# Patient Record
Sex: Female | Born: 1985 | Race: White | Hispanic: No | Marital: Single | Smoking: Current every day smoker
Health system: Southern US, Community
[De-identification: ages and names within clinical notes are randomized; demographics above are authoritative.]

## PROBLEM LIST (undated history)

## (undated) DIAGNOSIS — F419 Anxiety disorder, unspecified: Secondary | ICD-10-CM

## (undated) DIAGNOSIS — A549 Gonococcal infection, unspecified: Secondary | ICD-10-CM

## (undated) DIAGNOSIS — K219 Gastro-esophageal reflux disease without esophagitis: Secondary | ICD-10-CM

## (undated) DIAGNOSIS — A749 Chlamydial infection, unspecified: Secondary | ICD-10-CM

## (undated) DIAGNOSIS — N83209 Unspecified ovarian cyst, unspecified side: Secondary | ICD-10-CM

## (undated) DIAGNOSIS — I10 Essential (primary) hypertension: Secondary | ICD-10-CM

## (undated) DIAGNOSIS — E559 Vitamin D deficiency, unspecified: Secondary | ICD-10-CM

## (undated) DIAGNOSIS — A539 Syphilis, unspecified: Secondary | ICD-10-CM

## (undated) DIAGNOSIS — N390 Urinary tract infection, site not specified: Secondary | ICD-10-CM

## (undated) DIAGNOSIS — E282 Polycystic ovarian syndrome: Secondary | ICD-10-CM

## (undated) HISTORY — PX: TONSILLECTOMY AND ADENOIDECTOMY: SUR1326

## (undated) HISTORY — PX: WISDOM TOOTH EXTRACTION: SHX21

---
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
## 2012-03-06 ENCOUNTER — Encounter (INDEPENDENT_AMBULATORY_CARE_PROVIDER_SITE_OTHER): Payer: Self-pay | Admitting: General Surgery

## 2012-03-08 ENCOUNTER — Ambulatory Visit (INDEPENDENT_AMBULATORY_CARE_PROVIDER_SITE_OTHER): Payer: BC Managed Care – PPO | Admitting: General Surgery

## 2012-03-08 ENCOUNTER — Encounter (INDEPENDENT_AMBULATORY_CARE_PROVIDER_SITE_OTHER): Payer: Self-pay | Admitting: General Surgery

## 2012-03-08 VITALS — BP 124/72 | HR 88 | Temp 97.6°F | Resp 20 | Ht 64.0 in | Wt 199.6 lb

## 2012-03-08 DIAGNOSIS — R1031 Right lower quadrant pain: Secondary | ICD-10-CM

## 2012-03-08 DIAGNOSIS — R1903 Right lower quadrant abdominal swelling, mass and lump: Secondary | ICD-10-CM

## 2012-03-08 NOTE — Patient Instructions (Signed)
We will get CT scan to further evaluate the lump in your right lower abdominal wall

## 2012-03-08 NOTE — Progress Notes (Signed)
Patient ID: Mary Dennis, female   DOB: Dec 18, 1985, 27 y.o.   MRN: [id number]  Chief Complaint  Patient presents with  . New Evaluation    eval abd wall hernia    HPI Mary Dennis is a 27 y.o. female.   HPI 27 yo WF referred by Dr Neva Seat for evaluation of a possible RLQ hernia. The patient states that she has had a lump in her right lower abdominal wall for about a year. She states that she had a prior hernia repair as a small child and has had a C-section about 3 years ago. She states that the lump can be occasionally tender. It mainly bothers her around her menstrual cycle. She also states that area gets harder and a little bit larger with her cycle. She denies any trauma to the area. She denies any other soft tissue masses. She denies any weight loss. She denies any vomiting, diarrhea, constipation, melena, or hematochezia. She denies any fevers or chills. She does have a problem with frequent urinary tract infections. She does smoke cigarettes on a daily basis.  History reviewed. No pertinent past medical history.  Past Surgical History  Procedure Date  . Hernia repair 1995    Family History  Problem Relation Age of Onset  . Hypertension Father   . Hypertension Mother   . Diabetes Maternal Grandfather   . Breast cancer Maternal Aunt     Social History History  Substance Use Topics  . Smoking status: Current Every Day Smoker -- 0.5 packs/day  . Smokeless tobacco: Never Used  . Alcohol Use: No    No Known Allergies  Current Outpatient Prescriptions  Medication Sig Dispense Refill  . nitrofurantoin (MACRODANTIN) 100 MG capsule Take 100 mg by mouth 4 (four) times daily.        Review of Systems Review of Systems  Constitutional: Negative for fever, chills and unexpected weight change.  HENT: Negative for hearing loss, congestion, sore throat, trouble swallowing and voice change.   Eyes: Negative for visual disturbance.  Respiratory: Negative for cough and wheezing.     Cardiovascular: Negative for chest pain, palpitations and leg swelling.  Gastrointestinal: Negative for vomiting, diarrhea, constipation, blood in stool, abdominal distention and anal bleeding.  Genitourinary: Positive for dysuria (has frequent UTI). Negative for hematuria, vaginal bleeding, vaginal discharge, difficulty urinating, vaginal pain and menstrual problem.  Musculoskeletal: Negative for arthralgias.  Skin: Negative for rash and wound.  Neurological: Negative for seizures, syncope and headaches.  Hematological: Negative for adenopathy. Does not bruise/bleed easily.  Psychiatric/Behavioral: Negative for confusion.    Blood pressure 124/72, pulse 88, temperature 97.6 F (36.4 C), temperature source Temporal, resp. rate 20, height 5\' 4"  (1.626 m), weight 199 lb 9.6 oz (90.538 kg), last menstrual period 02/16/2012.  Physical Exam Physical Exam  Vitals reviewed. Constitutional: She is oriented to person, place, and time. She appears well-developed and well-nourished. No distress.  HENT:  Head: Normocephalic and atraumatic.  Right Ear: External ear normal.  Left Ear: External ear normal.  Eyes: Conjunctivae normal are normal. No scleral icterus.  Neck: Normal range of motion. Neck supple. No tracheal deviation present. No thyromegaly present.  Cardiovascular: Normal rate, regular rhythm and normal heart sounds.   Pulmonary/Chest: Effort normal and breath sounds normal. No respiratory distress. She has no wheezes.  Abdominal: Soft. Bowel sounds are normal. She exhibits mass. She exhibits no distension. There is no tenderness. There is no rebound and no guarding.         RLQ  abd wall mass - deep and superior to transverse incision. Firm, non mobile, only mild TTP. Somewhat circumscribed, irregular shape. About size of golfball, doesn't change in size or position with valsalva or standing.   Musculoskeletal: She exhibits no edema.  Lymphadenopathy:    She has no cervical adenopathy.   Neurological: She is alert and oriented to person, place, and time.  Skin: Skin is warm and dry. No rash noted. She is not diaphoretic. No erythema. No pallor.  Psychiatric: She has a normal mood and affect. Her behavior is normal. Judgment and thought content normal.    Data Reviewed Dr Paralee Cancel note from 01/2012  Assessment    Right lower quadrant mass with pain    Plan    Differential diagnosis includes incisional hernia vs lipoma vs atypical endometrioma. The physical exam is not entirely consistent with a lipoma. It does not have the typical well-circumscribed features of a lipoma. However it also does not have the typical physical findings of an incisional hernia. The patient nonetheless does have deep subcutaneous mass that does not change with position. It is near a previous Pfannenstiel incision. My concern is for incisional hernia with herniated fat. My recommendation was to get a CT scan of her abdomen pelvis to further delineate her abdominal wall anatomy. We did discuss signs and symptoms of incarceration and strangulation. She'll be set up for CT scan of her abdomen pelvis for tomorrow. Followup will be based on the results of her CT scan. We did discuss the importance of smoking cessation.  Mary Sella. Andrey Campanile, MD, FACS General, Bariatric, & Minimally Invasive Surgery [facility], [place]        [facility] M 03/08/2012, 11:00 AM

## 2012-03-09 ENCOUNTER — Ambulatory Visit
Admission: RE | Admit: 2012-03-09 | Discharge: 2012-03-09 | Disposition: A | Discharge status: Home / Self Care | Payer: BC Managed Care – PPO | Source: Ambulatory Visit | Attending: General Surgery | Admitting: General Surgery

## 2012-03-09 ENCOUNTER — Telehealth (INDEPENDENT_AMBULATORY_CARE_PROVIDER_SITE_OTHER): Payer: Self-pay | Admitting: General Surgery

## 2012-03-09 DIAGNOSIS — R1031 Right lower quadrant pain: Secondary | ICD-10-CM

## 2012-03-09 MED ORDER — IOHEXOL 300 MG/ML  SOLN
125.0000 mL | Freq: Once | INTRAMUSCULAR | Status: AC | PRN
  Administered 2012-03-09: 125 mL via INTRAVENOUS

## 2012-03-09 NOTE — Telephone Encounter (Signed)
Called to discuss CT results - looks like focus of endometriosis given symptoms. Discussed observation vs excision. Pt wants excision. Discussed risk/benefits/postop course. Will have office call pt to schedule pt for excision of right abdominal wall subcutaneous mass

## 2012-03-10 ENCOUNTER — Telehealth (INDEPENDENT_AMBULATORY_CARE_PROVIDER_SITE_OTHER): Payer: Self-pay

## 2012-03-10 NOTE — Telephone Encounter (Signed)
Pt called stating the cost of surgery will be difficult at this time. Pt wants Dr Andrey Campanile to advise her if this is something that must be done at this time. Pt states area is only sore at times of her period. Pt advised I will send msg to Dr Andrey Campanile for review. Pt can be reached at [phone].

## 2012-03-13 NOTE — Telephone Encounter (Signed)
No, it doesn't have to been done now. This is most consistent with a benign non-cancerous nodule. Can be taken out at her convenience or when financially able.

## 2012-03-14 NOTE — Telephone Encounter (Signed)
Patient made aware and will call us with any questions.

## 2012-03-14 NOTE — Telephone Encounter (Signed)
Left message on machine for patient to call back and ask for me. To make her aware of Dr Tawana Scale advise.

## 2012-04-14 ENCOUNTER — Telehealth (INDEPENDENT_AMBULATORY_CARE_PROVIDER_SITE_OTHER): Payer: Self-pay | Admitting: General Surgery

## 2012-04-14 NOTE — Telephone Encounter (Signed)
She called twice about her pain medication refill at the [facility] in [facility]. I spoke with the people at the [facility] pharmacy and they stated they had a prescription but she would have to come pick it up personally. I discussed this with her.

## 2012-04-14 NOTE — Telephone Encounter (Signed)
Pt called to ask Dr. Andrey Campanile for pain meds.  She is having her menstrual cycle now and having pain and tenderness.  Her GYN office is closed when she called to ask for pain meds.  Paged and updated Dr. Andrey Campanile; ordered Vicodin # 20.  Hydrocodone 5/325 mg,  # 20, 1-2 po Q4-6H prn pain, no refill called to [facility] ([facility]):  [id number].  Pt is aware.

## 2012-06-01 ENCOUNTER — Telehealth (INDEPENDENT_AMBULATORY_CARE_PROVIDER_SITE_OTHER): Payer: Self-pay | Admitting: General Surgery

## 2012-06-01 MED ORDER — TRAMADOL HCL 50 MG PO TABS: 50.0000 mg | ORAL_TABLET | Freq: Four times a day (QID) | ORAL | Status: AC | PRN

## 2012-06-01 NOTE — Telephone Encounter (Signed)
Please advise to call in new medication or call her in something for nausea? It looks like she was given Norco.

## 2012-06-01 NOTE — Telephone Encounter (Signed)
Message copied by Liliana Cline on Thu Jun 01, 2012  1:50 PM ------      Message from: Marin Shutter      Created: Thu Jun 01, 2012 12:56 PM      Regarding: Dr. Andrey Campanile      Contact: [phone]       Pt has pain with her endometriosis, and the medication that he prescribed gave her nausea.  Can he prescribe another med? Also, she is wondering if her gyno has closed his office.  She cannot reach him.  Thx ------

## 2012-06-01 NOTE — Telephone Encounter (Signed)
Tramadol RX sent to patient's pharmacy. Patient made aware. She will call with any problems.

## 2012-06-01 NOTE — Telephone Encounter (Signed)
Can have ultram 50 mg 1 tab po q6hr prn pain, disp 40; 0 refills. i have no idea about her gyn

## 2012-06-01 NOTE — Addendum Note (Signed)
Addended byLiliana Cline on: 06/01/2012 03:45 PM   Modules accepted: Orders

## 2012-06-02 NOTE — Telephone Encounter (Signed)
Patient called today stating the tramadol is making her nauseated, gave her diarrhea and "made her irritable." I explained that we don't really have much else to offer her. She states she has had Lortab before without problems but I explained that Norco has the same medication in it. I advised her to try ibuprofen and she states this makes her menstrual flow heavier. I advised that she should stick with the ibuprofen if it helps with the pain but I would ask Dr Andrey Campanile for additional advise.

## 2013-12-09 IMAGING — CT CT ABD-PELV W/ CM
2 of 4 series · 17 of 46 positions shown, 19 images · IV contrast (READICAT/WATER & [ID] OMNI 300)
Comparison: None.

CLINICAL DATA: Right lower quadrant pain, evaluate for possible
incisional hernia

CT ABDOMEN AND PELVIS WITH CONTRAST
TECHNIQUE: Multidetector CT imaging of the abdomen and pelvis was
performed following the standard protocol during bolus
administration of intravenous contrast.
Contrast: 125mL OMNIPAQUE IOHEXOL 300 MG/ML  SOLN

[Series 2: abd/pelvis with · axial · 0.80mm/px · z∈[-426,-26]mm · 14 of 88 slices shown, 16 images]
[im 4/88  soft-tissue]
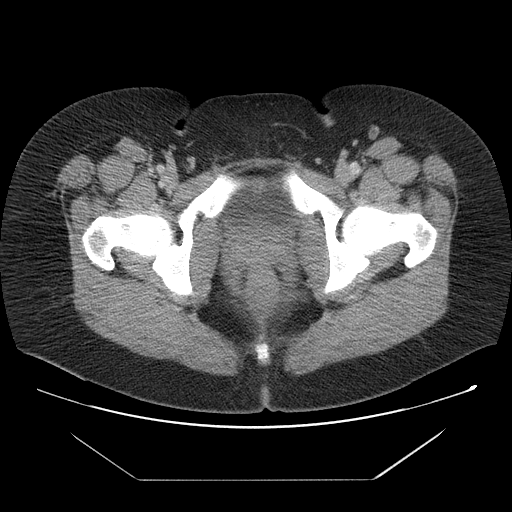
[im 4/88  bone]
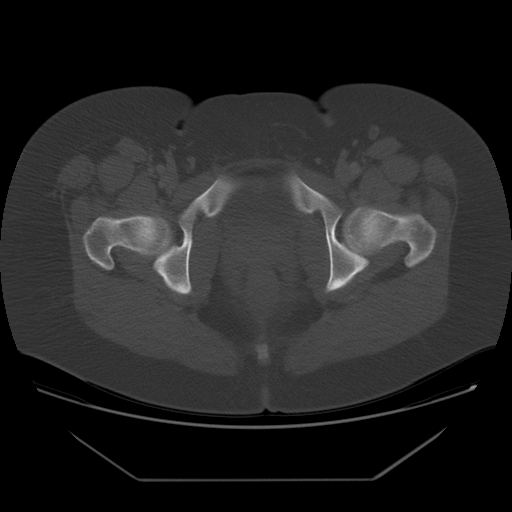
[im 11/88  soft-tissue]
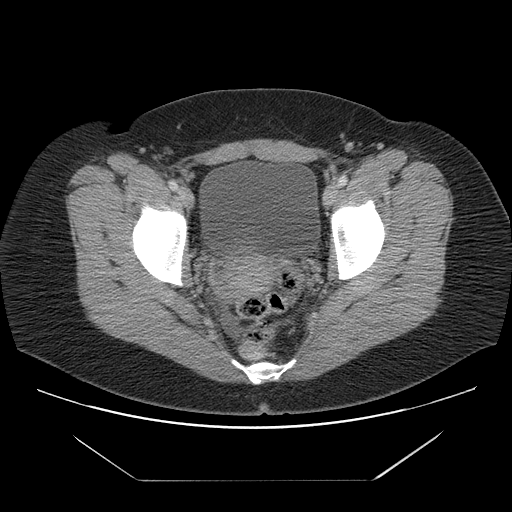
[im 18/88  soft-tissue]
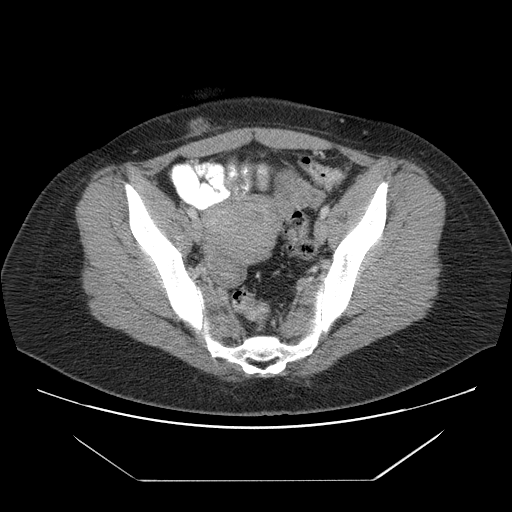
[im 25/88  soft-tissue]
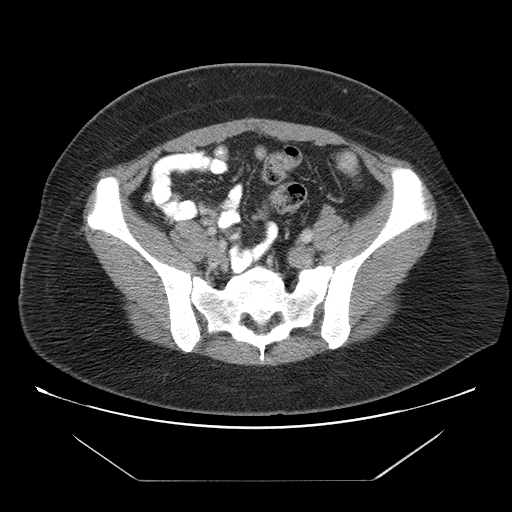
[im 28/88  soft-tissue]
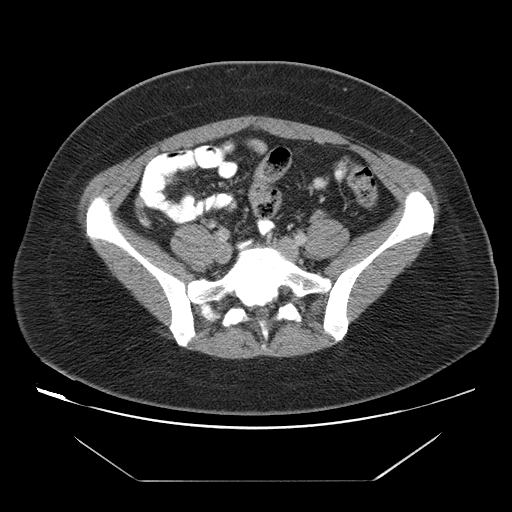
[im 35/88  soft-tissue]
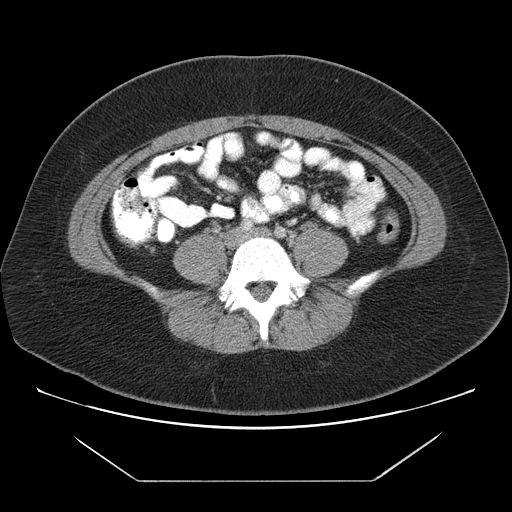
[im 42/88  soft-tissue]
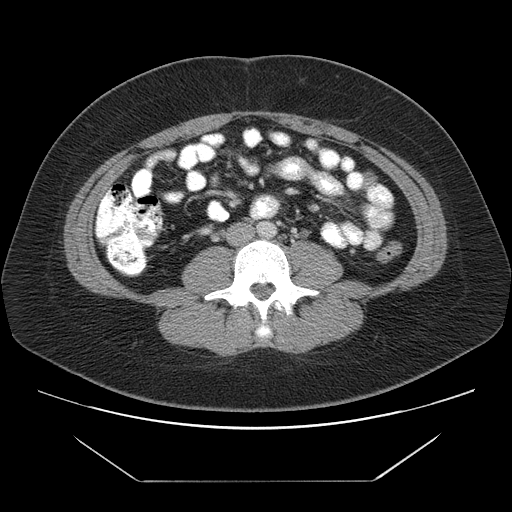
[im 46/88  soft-tissue]
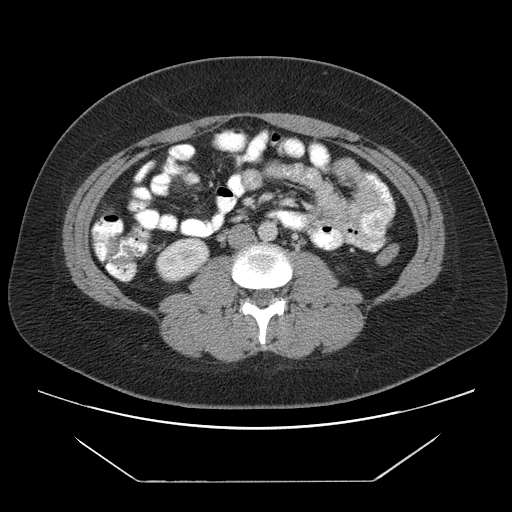
[im 53/88  soft-tissue]
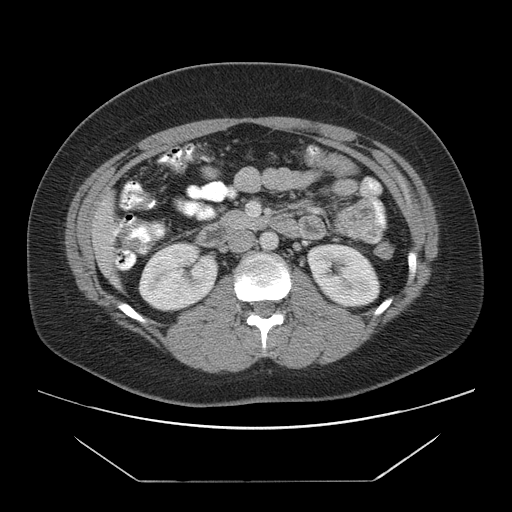
[im 53/88  bone]
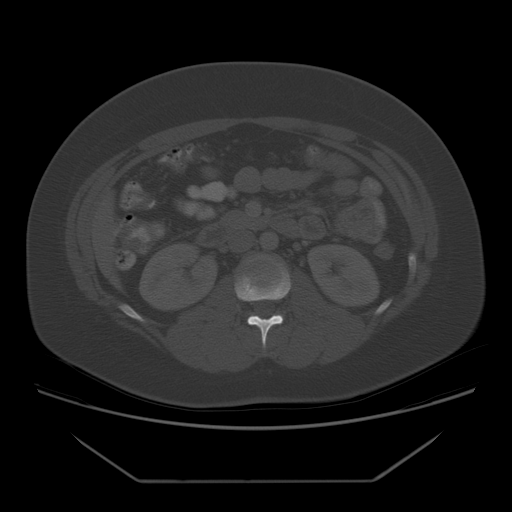
[im 60/88  soft-tissue]
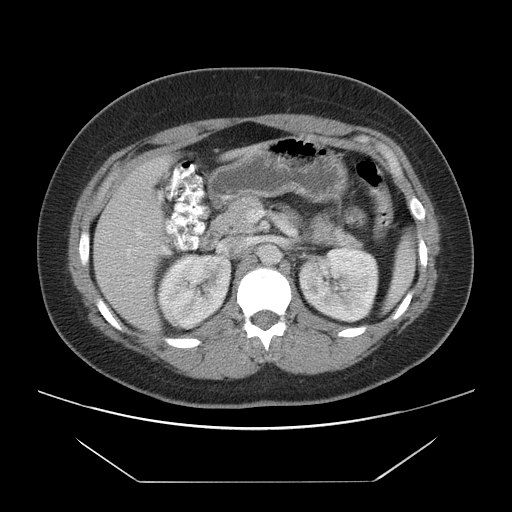
[im 67/88  soft-tissue]
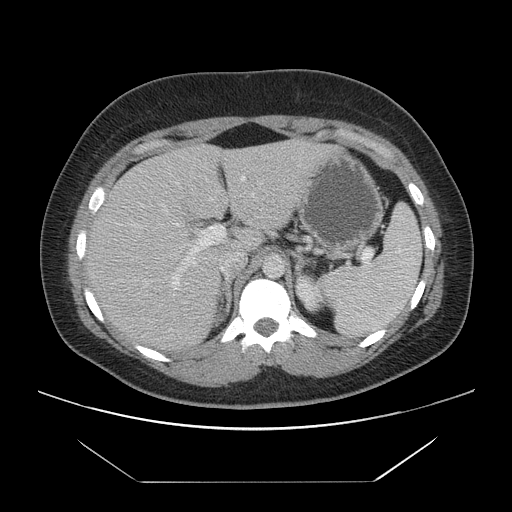
[im 70/88  soft-tissue]
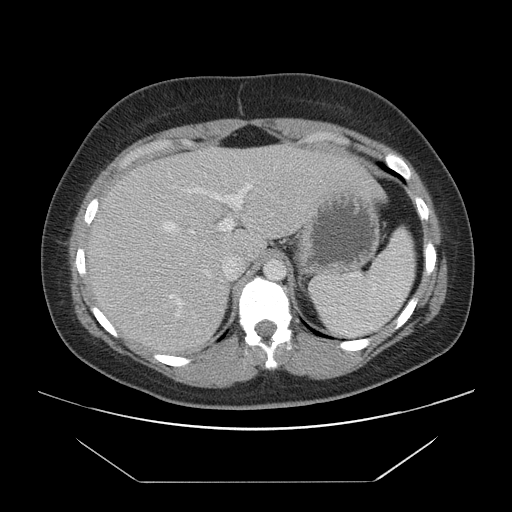
[im 77/88  soft-tissue]
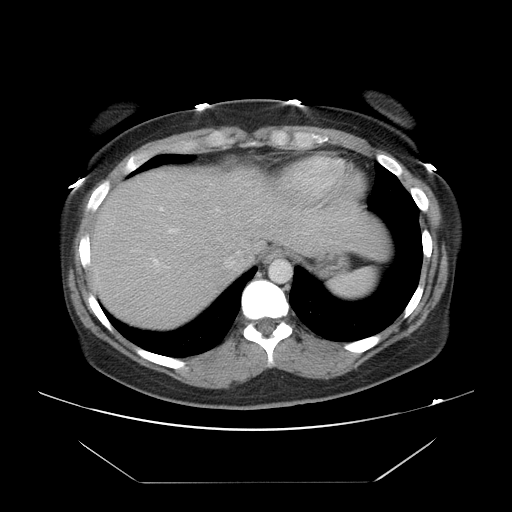
[im 84/88  soft-tissue]
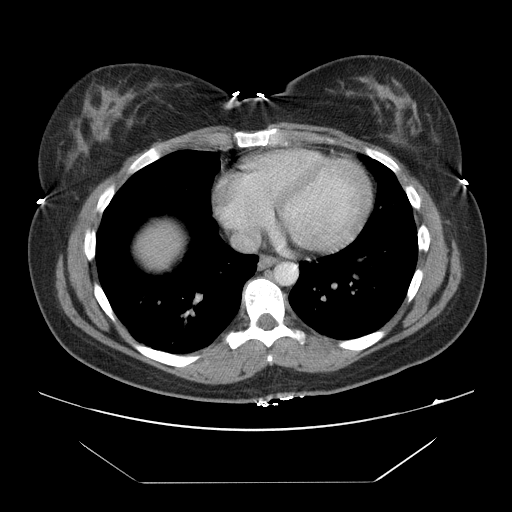

[Series 400: cor · coronal · 0.93mm/px · 3 of 121 slices shown]
[im 41/121  soft-tissue]
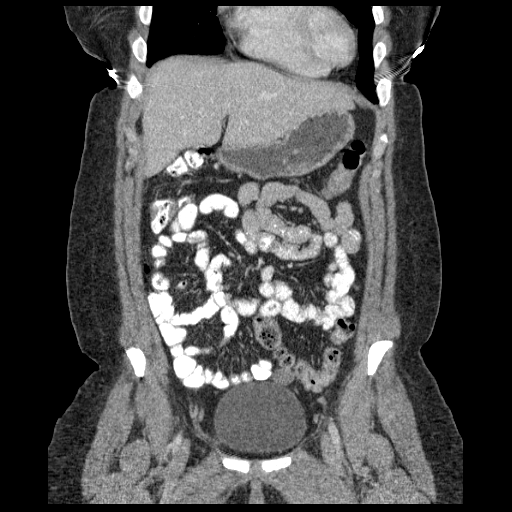
[im 54/121  soft-tissue]
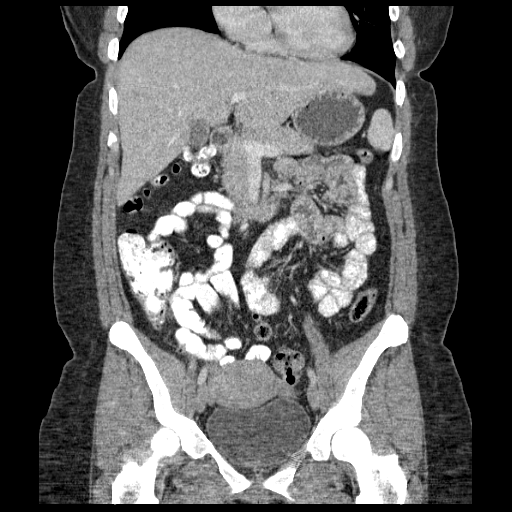
[im 67/121  soft-tissue]
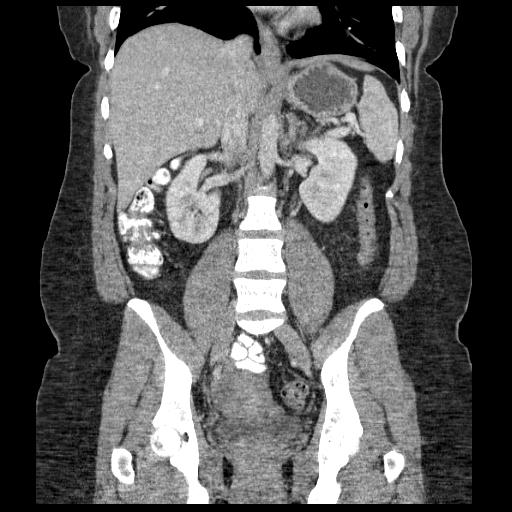

[17 of 46 positions shown; findings below may reference images not displayed]

FINDINGS: The lung bases are clear.  The liver enhances with no
focal abnormality and no ductal dilatation is seen.  No calcified
gallstones are noted.  The pancreas is normal in size and the
pancreatic duct is not dilated.  The adrenal glands and spleen are
unremarkable.  The stomach is moderately fluid distended with no
abnormality noted.  The kidneys enhance with no calculus or mass
and no hydronephrosis is seen.  The abdominal aorta is normal in
caliber.  No adenopathy is seen.

Within the superficial soft tissues of the right anterior pelvis,
at the site marked by the vitamin E tablet, there is a rounded soft
tissue nodule of 1.6 x 1.6 cm abutting the anterior margin of the
right rectus musculature.  This could be due to prior trauma or
surgery and represent scarring, versus possible desmoid tumor or
even endometriosis in the proper clinical setting.  However, no
hernia is seen at this site.  The uterus is normal in size.
Bilateral ovarian follicles are present. There is a small amount of
free fluid in the pelvis.  No abnormality of the colon is seen.
The terminal ileum and the appendix are unremarkable.  No bony
abnormality is seen.
IMPRESSION: 1.  At the site marked in the anterior right pelvis, there is a
superficial soft tissue nodule abutting the anterior margin of the
right rectus musculature.  This could represent scarring, desmoid
tumor or endometriosis in the proper clinical setting.
2.  No abdominal wall hernia is seen.

## 2015-01-07 LAB — OB RESULTS CONSOLE ABO/RH: RH TYPE: POSITIVE

## 2015-01-07 LAB — OB RESULTS CONSOLE ANTIBODY SCREEN: ANTIBODY SCREEN: NEGATIVE

## 2015-01-07 LAB — OB RESULTS CONSOLE GC/CHLAMYDIA
CHLAMYDIA, DNA PROBE: NEGATIVE
GC PROBE AMP, GENITAL: NEGATIVE

## 2015-02-05 LAB — OB RESULTS CONSOLE RUBELLA ANTIBODY, IGM: RUBELLA: IMMUNE

## 2015-02-05 LAB — OB RESULTS CONSOLE HIV ANTIBODY (ROUTINE TESTING): HIV: NONREACTIVE

## 2015-02-05 LAB — OB RESULTS CONSOLE RPR
RPR: NONREACTIVE
RPR: NONREACTIVE

## 2015-02-05 LAB — OB RESULTS CONSOLE HEPATITIS B SURFACE ANTIGEN: HEP B S AG: NEGATIVE

## 2015-03-27 LAB — OB RESULTS CONSOLE GBS: STREP GROUP B AG: NEGATIVE

## 2015-04-10 ENCOUNTER — Encounter (HOSPITAL_COMMUNITY): Payer: Self-pay | Admitting: Obstetrics and Gynecology

## 2015-04-10 DIAGNOSIS — O9989 Other specified diseases and conditions complicating pregnancy, childbirth and the puerperium: Secondary | ICD-10-CM

## 2015-04-10 DIAGNOSIS — Z98891 History of uterine scar from previous surgery: Secondary | ICD-10-CM

## 2015-04-10 DIAGNOSIS — N809 Endometriosis, unspecified: Secondary | ICD-10-CM | POA: Diagnosis present

## 2015-04-10 DIAGNOSIS — O10919 Unspecified pre-existing hypertension complicating pregnancy, unspecified trimester: Secondary | ICD-10-CM | POA: Diagnosis present

## 2015-04-10 DIAGNOSIS — Z2839 Other underimmunization status: Secondary | ICD-10-CM

## 2015-04-10 DIAGNOSIS — Z8719 Personal history of other diseases of the digestive system: Secondary | ICD-10-CM

## 2015-04-10 DIAGNOSIS — Z9889 Other specified postprocedural states: Secondary | ICD-10-CM

## 2015-04-10 DIAGNOSIS — N80129 Deep endometriosis of ovary, unspecified ovary: Secondary | ICD-10-CM | POA: Diagnosis present

## 2015-04-10 DIAGNOSIS — Z283 Underimmunization status: Secondary | ICD-10-CM

## 2015-04-10 NOTE — H&P (Signed)
Mary Dennis is a 30 y.o. female, G2P1001 at 71 1/7 weeks, presenting on 04/18/15 for scheduled repeat C/S, with possible removal of endometrioma located in subcutaneous tissue above incision.  Denies HA, visual sx, or epigastric pain.  On Labetalol 100 mg po BID.  Patient Active Problem List   Diagnosis Date Noted  . Previous cesarean section 04/10/2015  . Chronic hypertension during pregnancy, antepartum 04/10/2015  . Endometrioma 04/10/2015  . H/O hernia repair--1993 04/10/2015  . Rubella non-immune status, antepartum 04/10/2015    History of present pregnancy: Patient entered care at 24 weeks in transfer from [facility]. EDC of 04/24/15 was established by LMP, and in agreement with anatomy US.   Anatomy scan:  Around 20 weeks, with normal findings and a low-lying placenta.   Additional [place] evaluations:   28 6/7 weeks--Resolution of low-lying placenta, anterior, normal growth. Weekly BPPs from 36 weeks--All WNL, with normal fluid. 37 6/7 weeks:  EFW 7+13, 87%ile, normal fluid, vtx, BP 8/8.  Significant prenatal events: Transfer in at 24 weeks from [facility].  On Labetalol 100 mg po BID throughout pregnancy for chronic hypertension. PIH labs and PCR sent at 28 6/7 weeks.  Given Flexeril at 29 weeks for rib pain.  Declined TDAP and flu vaccine.  Initially desired VBAC, with consent signed.  Seen at 37 6/7 weeks with US showing EFW 7+13.  Cervix closed, long, vtx, -3.  Discussed need for induction by 39 weeks vs repeat C/S.  VBAC calculator 42%ile. Patient elected to proceed with scheduling repeat C/S at 39 weeks, with hopes for removal of an presumptive endometrioma in subcutaneous tissue above previous incision.   Last evaluation:  04/16/15--BP WNL, BPP 8/8, some constipation, OK'd to use Miralax  OB History    Gravida Para Term Preterm AB TAB SAB Ectopic Multiple Living   2010--Primary LTCS, single layer closure, 40 weeks, FTP, to 8 cm, delivered  in [place]  No past medical history on file. Past Surgical History  Procedure Laterality Date  . Hernia repair  1995   Family History: family history includes Breast cancer in her maternal aunt; Diabetes in her maternal grandfather; Hypertension in her father and mother.   Social History:  reports that she has been smoking.  She has never used smokeless tobacco. She reports that she does not drink alcohol or use illicit drugs.  Patient is single, with FOB involved.  Patient is a SAHM.   Prenatal Transfer Tool  Maternal Diabetes: No Genetic Screening: Declined Maternal Ultrasounds/Referrals: Normal Fetal Ultrasounds or other Referrals:  None Maternal Substance Abuse:  Yes:  Type: Smoker Significant Maternal Medications:  Meds include: Other: Labetalol 100 mg po BID Significant Maternal Lab Results: None  TDAP Declined Flu Declined  ROS:  Occasional UCs, +FM  No Known Allergies  Chest clear Heart RRR without murmur Abd gravid, NT, FH 41 Pelvic: Deferred Ext: DTR 1+, no clonus  FHR: 150 by doppler UCs:  None  Prenatal labs: ABO, Rh:  O+ Antibody:  Neg Rubella:  Non-immune RPR:   NR HBsAg:   Neg HIV:   NR GBS:  Neg 03/27/15 Sickle cell/Hgb electrophoresis:  NA Pap:  03/2014 GC:  Negative 09/20/14 Chlamydia:  Negative 09/20/14 Genetic screenings:  Declined Glucola:  WNL Other:   Hgb 13.7 at NOB, 11.5 at 28 weeks PIH labs and PCR done 02/05/15--WNL       Assessment/Plan: IUP at 39 1/7 weeks  Previous C/S, desires repeat Chronic hypertension--on Labetalol 100 mg po BID Hx hernia repair 1993  Plan: Admit to [facility] per consult with Dr. Estanislado Pandy Routine [facility] pre-op orders  Nyra Capes, [place] 04/10/2015, 5:29 PM

## 2015-04-11 ENCOUNTER — Other Ambulatory Visit: Payer: Self-pay | Admitting: Obstetrics and Gynecology

## 2015-04-15 ENCOUNTER — Encounter (HOSPITAL_COMMUNITY): Payer: Self-pay

## 2015-04-16 ENCOUNTER — Inpatient Hospital Stay (HOSPITAL_COMMUNITY)
Admission: RE | Admit: 2015-04-16 | Discharge: 2015-04-16 | Disposition: A | Discharge status: Home / Self Care | Payer: Medicaid Other | Source: Ambulatory Visit

## 2015-04-17 ENCOUNTER — Encounter (HOSPITAL_COMMUNITY)
Admission: RE | Admit: 2015-04-17 | Discharge: 2015-04-17 | Disposition: A | Discharge status: Home / Self Care | Payer: Medicaid Other | Source: Ambulatory Visit | Attending: Obstetrics and Gynecology | Admitting: Obstetrics and Gynecology

## 2015-04-17 ENCOUNTER — Encounter (HOSPITAL_COMMUNITY): Payer: Self-pay

## 2015-04-17 HISTORY — DX: Anxiety disorder, unspecified: F41.9

## 2015-04-17 HISTORY — DX: Syphilis, unspecified: A53.9

## 2015-04-17 HISTORY — DX: Urinary tract infection, site not specified: N39.0

## 2015-04-17 HISTORY — DX: Gastro-esophageal reflux disease without esophagitis: K21.9

## 2015-04-17 HISTORY — DX: Vitamin D deficiency, unspecified: E55.9

## 2015-04-17 HISTORY — DX: Essential (primary) hypertension: I10

## 2015-04-17 HISTORY — DX: Polycystic ovarian syndrome: E28.2

## 2015-04-17 HISTORY — DX: Chlamydial infection, unspecified: A74.9

## 2015-04-17 HISTORY — DX: Unspecified ovarian cyst, unspecified side: N83.209

## 2015-04-17 HISTORY — DX: Gonococcal infection, unspecified: A54.9

## 2015-04-17 LAB — CBC
HCT: 37.7 % (ref 36.0–46.0)
HEMOGLOBIN: 12.9 g/dL (ref 12.0–15.0)
MCH: 31.8 pg (ref 26.0–34.0)
MCHC: 34.2 g/dL (ref 30.0–36.0)
MCV: 92.9 fL (ref 78.0–100.0)
PLATELETS: 272 10*3/uL (ref 150–400)
RBC: 4.06 MIL/uL (ref 3.87–5.11)
RDW: 13.4 % (ref 11.5–15.5)
WBC: 9.5 10*3/uL (ref 4.0–10.5)

## 2015-04-17 LAB — ABO/RH: ABO/RH(D): O POS

## 2015-04-17 NOTE — Patient Instructions (Addendum)
Your procedure is scheduled on:  Friday, Feb. 24, 2017  Enter through the Main Entrance of [facility] at:  11:30 A.M.  Pick up the phone at the desk and dial [phone].  Call this number if you have problems the morning of surgery: [phone].  Remember:  Do NOT eat food: After Midnight Tonight  Do NOT drink clear liquids after: 9:00 A.M. Day of surgery  Take these medicines the morning of surgery with a SIP OF WATER:  Labetalol  *Do NOT smoke the day of surgery  Do NOT wear jewelry (body piercing), metal hair clips/bobby pins, make-up, or nail polish. Do NOT wear lotions, powders, or perfumes.  You may wear deoderant. Do NOT shave for 48 hours prior to surgery. Do NOT bring valuables to the hospital.  Leave suitcase in car.  After surgery it may be brought to your room.  For patients admitted to the hospital, checkout time is 11:00 AM the day of discharge.

## 2015-04-18 ENCOUNTER — Inpatient Hospital Stay (HOSPITAL_COMMUNITY)
Admission: AD | Admit: 2015-04-18 | Discharge: 2015-04-20 | DRG: 766 | Disposition: A | Discharge status: Home / Self Care | Payer: Medicaid Other | Source: Ambulatory Visit | Attending: Obstetrics and Gynecology | Admitting: Obstetrics and Gynecology

## 2015-04-18 ENCOUNTER — Inpatient Hospital Stay (HOSPITAL_COMMUNITY): Payer: Medicaid Other | Admitting: Anesthesiology

## 2015-04-18 ENCOUNTER — Encounter (HOSPITAL_COMMUNITY): Payer: Self-pay | Admitting: Emergency Medicine

## 2015-04-18 ENCOUNTER — Encounter (HOSPITAL_COMMUNITY)
Admission: AD | Disposition: A | Discharge status: Home / Self Care | Payer: Self-pay | Source: Ambulatory Visit | Attending: Obstetrics and Gynecology

## 2015-04-18 DIAGNOSIS — Z8719 Personal history of other diseases of the digestive system: Secondary | ICD-10-CM

## 2015-04-18 DIAGNOSIS — Z3A39 39 weeks gestation of pregnancy: Secondary | ICD-10-CM | POA: Diagnosis not present

## 2015-04-18 DIAGNOSIS — O09899 Supervision of other high risk pregnancies, unspecified trimester: Secondary | ICD-10-CM

## 2015-04-18 DIAGNOSIS — O164 Unspecified maternal hypertension, complicating childbirth: Principal | ICD-10-CM | POA: Diagnosis present

## 2015-04-18 DIAGNOSIS — O9989 Other specified diseases and conditions complicating pregnancy, childbirth and the puerperium: Secondary | ICD-10-CM

## 2015-04-18 DIAGNOSIS — Z283 Underimmunization status: Secondary | ICD-10-CM

## 2015-04-18 DIAGNOSIS — Z9889 Other specified postprocedural states: Secondary | ICD-10-CM

## 2015-04-18 DIAGNOSIS — N808 Other endometriosis: Secondary | ICD-10-CM | POA: Diagnosis present

## 2015-04-18 DIAGNOSIS — N809 Endometriosis, unspecified: Secondary | ICD-10-CM | POA: Diagnosis present

## 2015-04-18 DIAGNOSIS — O10919 Unspecified pre-existing hypertension complicating pregnancy, unspecified trimester: Secondary | ICD-10-CM | POA: Diagnosis present

## 2015-04-18 DIAGNOSIS — O99334 Smoking (tobacco) complicating childbirth: Secondary | ICD-10-CM | POA: Diagnosis present

## 2015-04-18 DIAGNOSIS — Z2839 Other underimmunization status: Secondary | ICD-10-CM

## 2015-04-18 DIAGNOSIS — O34219 Maternal care for unspecified type scar from previous cesarean delivery: Secondary | ICD-10-CM | POA: Diagnosis present

## 2015-04-18 DIAGNOSIS — N80129 Deep endometriosis of ovary, unspecified ovary: Secondary | ICD-10-CM | POA: Diagnosis present

## 2015-04-18 DIAGNOSIS — Z98891 History of uterine scar from previous surgery: Secondary | ICD-10-CM

## 2015-04-18 LAB — RPR: RPR: NONREACTIVE

## 2015-04-18 LAB — PREPARE RBC (CROSSMATCH)

## 2015-04-18 SURGERY — Surgical Case
Anesthesia: Spinal

## 2015-04-18 MED ORDER — NALOXONE HCL 2 MG/2ML IJ SOSY: 1.0000 ug/kg/h | PREFILLED_SYRINGE | INTRAVENOUS | Status: DC | PRN

## 2015-04-18 MED ORDER — ONDANSETRON HCL 4 MG/2ML IJ SOLN: 4.0000 mg | Freq: Once | INTRAMUSCULAR | Status: DC | PRN

## 2015-04-18 MED ORDER — LACTATED RINGERS IV SOLN
40.0000 [IU] | INTRAVENOUS | Status: DC | PRN
  Administered 2015-04-18: 40 [IU] via INTRAVENOUS

## 2015-04-18 MED ORDER — PRENATAL MULTIVITAMIN CH
1.0000 | ORAL_TABLET | Freq: Every day | ORAL | Status: DC
  Administered 2015-04-19 – 2015-04-20 (×2): 1 via ORAL
  Filled 2015-04-18 (×2): qty 1

## 2015-04-18 MED ORDER — LANOLIN HYDROUS EX OINT: 1.0000 "application " | TOPICAL_OINTMENT | CUTANEOUS | Status: DC | PRN

## 2015-04-18 MED ORDER — DIPHENHYDRAMINE HCL 25 MG PO CAPS: 25.0000 mg | ORAL_CAPSULE | Freq: Four times a day (QID) | ORAL | Status: DC | PRN

## 2015-04-18 MED ORDER — MORPHINE SULFATE (PF) 0.5 MG/ML IJ SOLN
INTRAMUSCULAR | Status: DC | PRN
  Administered 2015-04-18: .2 mg via EPIDURAL

## 2015-04-18 MED ORDER — SENNOSIDES-DOCUSATE SODIUM 8.6-50 MG PO TABS
2.0000 | ORAL_TABLET | ORAL | Status: DC
  Administered 2015-04-18 – 2015-04-20 (×2): 2 via ORAL
  Filled 2015-04-18 (×2): qty 2

## 2015-04-18 MED ORDER — ONDANSETRON HCL 4 MG/2ML IJ SOLN
INTRAMUSCULAR | Status: DC | PRN
  Administered 2015-04-18: 4 mg via INTRAVENOUS

## 2015-04-18 MED ORDER — FENTANYL CITRATE (PF) 100 MCG/2ML IJ SOLN: 25.0000 ug | INTRAMUSCULAR | Status: DC | PRN

## 2015-04-18 MED ORDER — DIBUCAINE 1 % RE OINT: 1.0000 "application " | TOPICAL_OINTMENT | RECTAL | Status: DC | PRN

## 2015-04-18 MED ORDER — KETOROLAC TROMETHAMINE 30 MG/ML IJ SOLN
30.0000 mg | Freq: Four times a day (QID) | INTRAMUSCULAR | Status: AC | PRN
  Administered 2015-04-18: 30 mg via INTRAMUSCULAR

## 2015-04-18 MED ORDER — PHENYLEPHRINE 8 MG IN D5W 100 ML (0.08MG/ML) PREMIX OPTIME: INJECTION | INTRAVENOUS | Status: DC | PRN

## 2015-04-18 MED ORDER — PHENYLEPHRINE 8 MG IN D5W 100 ML (0.08MG/ML) PREMIX OPTIME
INJECTION | INTRAVENOUS | Status: AC
  Filled 2015-04-18: qty 100

## 2015-04-18 MED ORDER — LACTATED RINGERS IV SOLN
INTRAVENOUS | Status: DC | PRN
  Administered 2015-04-18: 16:00:00 via INTRAVENOUS

## 2015-04-18 MED ORDER — SIMETHICONE 80 MG PO CHEW
80.0000 mg | CHEWABLE_TABLET | Freq: Three times a day (TID) | ORAL | Status: DC
  Administered 2015-04-19 – 2015-04-20 (×5): 80 mg via ORAL
  Filled 2015-04-18 (×5): qty 1

## 2015-04-18 MED ORDER — MEPERIDINE HCL 25 MG/ML IJ SOLN
INTRAMUSCULAR | Status: DC | PRN
  Administered 2015-04-18: 12.5 mg via INTRAVENOUS

## 2015-04-18 MED ORDER — LACTATED RINGERS IV SOLN
Freq: Once | INTRAVENOUS | Status: AC
  Administered 2015-04-18: 12:00:00 via INTRAVENOUS

## 2015-04-18 MED ORDER — KETOROLAC TROMETHAMINE 30 MG/ML IJ SOLN: 30.0000 mg | Freq: Four times a day (QID) | INTRAMUSCULAR | Status: AC | PRN

## 2015-04-18 MED ORDER — CEFAZOLIN SODIUM-DEXTROSE 2-3 GM-% IV SOLR
INTRAVENOUS | Status: AC
  Filled 2015-04-18: qty 50

## 2015-04-18 MED ORDER — OXYTOCIN 10 UNIT/ML IJ SOLN
INTRAMUSCULAR | Status: AC
  Filled 2015-04-18: qty 4

## 2015-04-18 MED ORDER — SCOPOLAMINE 1 MG/3DAYS TD PT72
MEDICATED_PATCH | TRANSDERMAL | Status: AC
  Administered 2015-04-18: 1.5 mg via TRANSDERMAL
  Filled 2015-04-18: qty 1

## 2015-04-18 MED ORDER — NALBUPHINE HCL 10 MG/ML IJ SOLN: 5.0000 mg | Freq: Once | INTRAMUSCULAR | Status: DC | PRN

## 2015-04-18 MED ORDER — BUPIVACAINE IN DEXTROSE 0.75-8.25 % IT SOLN
INTRATHECAL | Status: DC | PRN
  Administered 2015-04-18: 1.6 mL via INTRATHECAL

## 2015-04-18 MED ORDER — ACETAMINOPHEN 500 MG PO TABS
1000.0000 mg | ORAL_TABLET | Freq: Four times a day (QID) | ORAL | Status: AC
  Administered 2015-04-18 – 2015-04-19 (×2): 1000 mg via ORAL
  Filled 2015-04-18 (×2): qty 2

## 2015-04-18 MED ORDER — MORPHINE SULFATE (PF) 0.5 MG/ML IJ SOLN
INTRAMUSCULAR | Status: AC
  Filled 2015-04-18: qty 10

## 2015-04-18 MED ORDER — SIMETHICONE 80 MG PO CHEW: 80.0000 mg | CHEWABLE_TABLET | ORAL | Status: DC | PRN

## 2015-04-18 MED ORDER — METHYLERGONOVINE MALEATE 0.2 MG/ML IJ SOLN: 0.2000 mg | INTRAMUSCULAR | Status: DC | PRN

## 2015-04-18 MED ORDER — TETANUS-DIPHTH-ACELL PERTUSSIS 5-2.5-18.5 LF-MCG/0.5 IM SUSP: 0.5000 mL | Freq: Once | INTRAMUSCULAR | Status: DC

## 2015-04-18 MED ORDER — WITCH HAZEL-GLYCERIN EX PADS: 1.0000 "application " | MEDICATED_PAD | CUTANEOUS | Status: DC | PRN

## 2015-04-18 MED ORDER — ONDANSETRON HCL 4 MG/2ML IJ SOLN: 4.0000 mg | Freq: Three times a day (TID) | INTRAMUSCULAR | Status: DC | PRN

## 2015-04-18 MED ORDER — ONDANSETRON HCL 4 MG/2ML IJ SOLN
INTRAMUSCULAR | Status: AC
  Filled 2015-04-18: qty 2

## 2015-04-18 MED ORDER — ACETAMINOPHEN 325 MG PO TABS: 650.0000 mg | ORAL_TABLET | ORAL | Status: DC | PRN

## 2015-04-18 MED ORDER — PHENYLEPHRINE 8 MG IN D5W 100 ML (0.08MG/ML) PREMIX OPTIME
INJECTION | INTRAVENOUS | Status: DC | PRN
  Administered 2015-04-18: 60 ug/min via INTRAVENOUS

## 2015-04-18 MED ORDER — LACTATED RINGERS IV SOLN
INTRAVENOUS | Status: DC | PRN
  Administered 2015-04-18 (×2): via INTRAVENOUS

## 2015-04-18 MED ORDER — KETOROLAC TROMETHAMINE 30 MG/ML IJ SOLN
INTRAMUSCULAR | Status: AC
  Administered 2015-04-18: 30 mg via INTRAMUSCULAR
  Filled 2015-04-18: qty 1

## 2015-04-18 MED ORDER — NALOXONE HCL 0.4 MG/ML IJ SOLN: 0.4000 mg | INTRAMUSCULAR | Status: DC | PRN

## 2015-04-18 MED ORDER — DIPHENHYDRAMINE HCL 50 MG/ML IJ SOLN: 12.5000 mg | INTRAMUSCULAR | Status: DC | PRN

## 2015-04-18 MED ORDER — SODIUM CHLORIDE 0.9% FLUSH: 3.0000 mL | INTRAVENOUS | Status: DC | PRN

## 2015-04-18 MED ORDER — OXYTOCIN 10 UNIT/ML IJ SOLN: 2.5000 [IU]/h | INTRAVENOUS | Status: AC

## 2015-04-18 MED ORDER — CEFAZOLIN SODIUM-DEXTROSE 2-3 GM-% IV SOLR
2.0000 g | INTRAVENOUS | Status: AC
  Administered 2015-04-18: 2 g via INTRAVENOUS

## 2015-04-18 MED ORDER — MENTHOL 3 MG MT LOZG: 1.0000 | LOZENGE | OROMUCOSAL | Status: DC | PRN

## 2015-04-18 MED ORDER — FENTANYL CITRATE (PF) 100 MCG/2ML IJ SOLN
INTRAMUSCULAR | Status: AC
  Filled 2015-04-18: qty 2

## 2015-04-18 MED ORDER — LABETALOL HCL 100 MG PO TABS
100.0000 mg | ORAL_TABLET | Freq: Two times a day (BID) | ORAL | Status: DC
  Administered 2015-04-18 – 2015-04-20 (×4): 100 mg via ORAL
  Filled 2015-04-18 (×6): qty 1

## 2015-04-18 MED ORDER — NALBUPHINE HCL 10 MG/ML IJ SOLN: 5.0000 mg | INTRAMUSCULAR | Status: DC | PRN

## 2015-04-18 MED ORDER — SCOPOLAMINE 1 MG/3DAYS TD PT72
1.0000 | MEDICATED_PATCH | Freq: Once | TRANSDERMAL | Status: DC
  Administered 2015-04-18: 1.5 mg via TRANSDERMAL

## 2015-04-18 MED ORDER — FERROUS SULFATE 325 (65 FE) MG PO TABS
325.0000 mg | ORAL_TABLET | Freq: Two times a day (BID) | ORAL | Status: DC
  Administered 2015-04-19 – 2015-04-20 (×3): 325 mg via ORAL
  Filled 2015-04-18 (×3): qty 1

## 2015-04-18 MED ORDER — BUPIVACAINE HCL (PF) 0.25 % IJ SOLN
INTRAMUSCULAR | Status: AC
  Filled 2015-04-18: qty 30

## 2015-04-18 MED ORDER — DIPHENHYDRAMINE HCL 25 MG PO CAPS: 25.0000 mg | ORAL_CAPSULE | ORAL | Status: DC | PRN

## 2015-04-18 MED ORDER — MEPERIDINE HCL 25 MG/ML IJ SOLN: 6.2500 mg | INTRAMUSCULAR | Status: DC | PRN

## 2015-04-18 MED ORDER — SIMETHICONE 80 MG PO CHEW
80.0000 mg | CHEWABLE_TABLET | ORAL | Status: DC
  Administered 2015-04-18 – 2015-04-20 (×2): 80 mg via ORAL
  Filled 2015-04-18 (×2): qty 1

## 2015-04-18 MED ORDER — IBUPROFEN 600 MG PO TABS
600.0000 mg | ORAL_TABLET | Freq: Four times a day (QID) | ORAL | Status: DC
  Administered 2015-04-18 – 2015-04-20 (×7): 600 mg via ORAL
  Filled 2015-04-18 (×7): qty 1

## 2015-04-18 MED ORDER — MEPERIDINE HCL 25 MG/ML IJ SOLN
INTRAMUSCULAR | Status: AC
  Filled 2015-04-18: qty 1

## 2015-04-18 MED ORDER — BUPIVACAINE HCL (PF) 0.25 % IJ SOLN
INTRAMUSCULAR | Status: DC | PRN
  Administered 2015-04-18: 20 mL

## 2015-04-18 MED ORDER — METHYLERGONOVINE MALEATE 0.2 MG PO TABS: 0.2000 mg | ORAL_TABLET | ORAL | Status: DC | PRN

## 2015-04-18 MED ORDER — SODIUM CHLORIDE 0.9 % IR SOLN
Status: DC | PRN
  Administered 2015-04-18: 1000 mL

## 2015-04-18 MED ORDER — SCOPOLAMINE 1 MG/3DAYS TD PT72: 1.0000 | MEDICATED_PATCH | Freq: Once | TRANSDERMAL | Status: DC

## 2015-04-18 MED ORDER — FENTANYL CITRATE (PF) 100 MCG/2ML IJ SOLN
INTRAMUSCULAR | Status: DC | PRN
  Administered 2015-04-18: 10 ug via INTRAVENOUS

## 2015-04-18 MED ORDER — MEASLES, MUMPS & RUBELLA VAC ~~LOC~~ INJ: 0.5000 mL | INJECTION | Freq: Once | SUBCUTANEOUS | Status: DC

## 2015-04-18 MED ORDER — ZOLPIDEM TARTRATE 5 MG PO TABS: 5.0000 mg | ORAL_TABLET | Freq: Every evening | ORAL | Status: DC | PRN

## 2015-04-18 SURGICAL SUPPLY — 38 items
BARRIER ADHS 3X4 INTERCEED (GAUZE/BANDAGES/DRESSINGS) ×3 IMPLANT
BENZOIN TINCTURE PRP APPL 2/3 (GAUZE/BANDAGES/DRESSINGS) ×3 IMPLANT
BOOTIES KNEE HIGH SLOAN (MISCELLANEOUS) ×6 IMPLANT
CLAMP CORD UMBIL (MISCELLANEOUS) IMPLANT
CLOSURE WOUND 1/2 X4 (GAUZE/BANDAGES/DRESSINGS) ×1
CLOTH BEACON ORANGE TIMEOUT ST (SAFETY) ×3 IMPLANT
DRAIN JACKSON PRT FLT 10 (DRAIN) IMPLANT
DRSG OPSITE POSTOP 4X10 (GAUZE/BANDAGES/DRESSINGS) ×3 IMPLANT
DURAPREP 26ML APPLICATOR (WOUND CARE) ×3 IMPLANT
ELECT REM PT RETURN 9FT ADLT (ELECTROSURGICAL) ×3
ELECTRODE REM PT RTRN 9FT ADLT (ELECTROSURGICAL) ×1 IMPLANT
EVACUATOR SILICONE 100CC (DRAIN) IMPLANT
EXTRACTOR VACUUM M CUP 4 TUBE (SUCTIONS) IMPLANT
EXTRACTOR VACUUM M CUP 4' TUBE (SUCTIONS)
GLOVE BIOGEL PI IND STRL 7.0 (GLOVE) ×2 IMPLANT
GLOVE BIOGEL PI INDICATOR 7.0 (GLOVE) ×4
GLOVE ECLIPSE 6.5 STRL STRAW (GLOVE) ×3 IMPLANT
GOWN STRL REUS W/TWL LRG LVL3 (GOWN DISPOSABLE) ×6 IMPLANT
KIT ABG SYR 3ML LUER SLIP (SYRINGE) IMPLANT
NEEDLE HYPO 22GX1.5 SAFETY (NEEDLE) ×3 IMPLANT
NEEDLE HYPO 25X5/8 SAFETYGLIDE (NEEDLE) IMPLANT
NS IRRIG 1000ML POUR BTL (IV SOLUTION) ×6 IMPLANT
PACK C SECTION WH (CUSTOM PROCEDURE TRAY) ×3 IMPLANT
PAD OB MATERNITY 4.3X12.25 (PERSONAL CARE ITEMS) ×3 IMPLANT
PENCIL SMOKE EVAC W/HOLSTER (ELECTROSURGICAL) ×3 IMPLANT
RTRCTR C-SECT PINK 25CM LRG (MISCELLANEOUS) ×3 IMPLANT
SPONGE LAP 18X18 X RAY DECT (DISPOSABLE) ×3 IMPLANT
STRIP CLOSURE SKIN 1/2X4 (GAUZE/BANDAGES/DRESSINGS) ×2 IMPLANT
SUT MNCRL AB 3-0 PS2 27 (SUTURE) ×3 IMPLANT
SUT SILK 2 0 FSL 18 (SUTURE) IMPLANT
SUT VIC AB 0 CTX 36 (SUTURE) ×6
SUT VIC AB 0 CTX36XBRD ANBCTRL (SUTURE) ×3 IMPLANT
SUT VIC AB 1 CT1 36 (SUTURE) ×6 IMPLANT
SUT VIC AB 2-0 CT1 27 (SUTURE)
SUT VIC AB 2-0 CT1 TAPERPNT 27 (SUTURE) IMPLANT
SYR 20CC LL (SYRINGE) ×3 IMPLANT
TOWEL OR 17X24 6PK STRL BLUE (TOWEL DISPOSABLE) ×3 IMPLANT
TRAY FOLEY CATH SILVER 14FR (SET/KITS/TRAYS/PACK) ×3 IMPLANT

## 2015-04-18 NOTE — Anesthesia Postprocedure Evaluation (Addendum)
Anesthesia Post Note  Patient: Mary Dennis  Procedure(s) Performed: Procedure(s) (LRB): CESAREAN SECTION (N/A)  Patient location during evaluation: PACU Anesthesia Type: Spinal Level of consciousness: awake and alert and oriented Pain management: pain level controlled Vital Signs Assessment: post-procedure vital signs reviewed and stable Respiratory status: nonlabored ventilation, spontaneous breathing and respiratory function stable Cardiovascular status: blood pressure returned to baseline and stable Postop Assessment: no headache, no backache, no signs of nausea or vomiting, epidural receding and spinal receding Anesthetic complications: no    Last Vitals:  Filed Vitals:   04/18/15 1759 04/18/15 1852  BP: 134/75 131/81  Pulse: 72 77  Temp: 36.5 C 37.3 C  Resp: 20 18    Last Pain:  Filed Vitals:   04/18/15 1900  PainSc: 0-No pain                 [name] A.

## 2015-04-18 NOTE — Anesthesia Procedure Notes (Signed)
Epidural Patient location during procedure: OR  Staffing Anesthesiologist: Caelyn Route Performed by: anesthesiologist   Preanesthetic Checklist Completed: patient identified, site marked, surgical consent, pre-op evaluation, timeout performed, IV checked, risks and benefits discussed and monitors and equipment checked  Epidural Patient position: sitting Prep: site prepped and draped and DuraPrep Patient monitoring: continuous pulse ox and blood pressure Approach: midline Location: L3-L4 Injection technique: LOR saline  Needle:  Needle type: Tuohy  Needle gauge: 17 G Needle length: 9 cm and 9 Needle insertion depth: 5 cm cm Catheter type: closed end flexible Catheter size: 19 Gauge Catheter at skin depth: 10 cm Test dose: negative  Assessment Events: blood not aspirated, injection not painful, no injection resistance, negative IV test and no paresthesia  Additional Notes Patient identified. Risks/Benefits/Options discussed with patient including but not limited to bleeding, infection, nerve damage, paralysis, failed block, incomplete pain control, headache, blood pressure changes, nausea, vomiting, reactions to medication both or allergic, itching and postpartum back pain. Confirmed with bedside nurse the patient's most recent platelet count. Confirmed with patient that they are not currently taking any anticoagulation, have any bleeding history or any family history of bleeding disorders. Patient expressed understanding and wished to proceed. All questions were answered. Sterile technique was used throughout the entire procedure. Please see nursing notes for vital signs. . Warning signs of high block given to the patient including shortness of breath, tingling/numbness in hands, complete motor block, or any concerning symptoms with instructions to call for help. Patient was given instructions on fall risk and not to get out of bed. All questions and concerns addressed with instructions  to call with any issues or inadequate analgesia.    A spinal was first attempted x2 passes but os encountered and unable to obtain CSF. Switched to a CSE given body habitus and better feel with tuohy. LOR easily obtained on first pass at 7cm. 25ga spinocan passes through [place] and clear CSF return. Spinal dose given then needle withdrawn. Epidural catheter easily threaded and then tuohy removed.

## 2015-04-18 NOTE — Op Note (Signed)
Preoperative diagnosis: Intrauterine pregnancy at 39 weeks and 1 days , previous cesarean section, abdominal wall mass  Post operative diagnosis: Same  Anesthesia: Spinal  Anesthesiologist: Dr. Karie Schwalbe  Procedure: Repeat low transverse cesarean section and removal of abdominal wall mass  Surgeon: Dr. Dois Davenport Angus Amini  Assistant: Kerry Fort powell PA-C  Estimated blood loss: 600 cc  Procedure:  After being informed of the planned procedure and possible complications including bleeding, infection, injury to other organs, informed consent is obtained. The patient is taken to OR #9 and given spinal anesthesia without complication. She is placed in the dorsal decubitus position with the pelvis tilted to the left. She is then prepped and draped in a sterile fashion. A Foley catheter is inserted in her bladder.  After assessing adequate level of anesthesia, we infiltrate the suprapubic area with 20 cc of Marcaine 0.25 and perform a Pfannenstiel incision which is brought down sharply to the fascia. The fascia is entered in a low transverse fashion. Linea alba is dissected. Peritoneum is entered in a midline fashion. An Alexis retractor is easily positioned.   The myometrium is then entered in a low transverse fashion, 2 cm above the vesico-uterine junction ; first with knife and then extended bluntly. Amniotic fluid is clear. We assist the birth of a female  infant in vertex presentation. Mouth and nose are suctioned. The baby is delivered. The cord is clamped and sectioned. The baby is given to the neonatologist present in the room.  10 cc of blood is drawn from the umbilical vein.The placenta is allowed to deliver spontaneously. It is complete and the cord has 3 vessels. Uterine revision is negative.  We proceed with closure of the myometrium in 2 layers: First with a running locked suture of 0 Vicryl, then with a Lembert suture of 0 Vicryl imbricating the first one. Hemostasis is completed with  cauterization on peritoneal edges and with a figure-of-eight suture of 0-Vicryl midline.  Both paracolic gutters are cleaned. Both tubes and ovaries are assessed and normal. The pelvis is profusely irrigated with warm saline to confirm a satisfactory hemostasis.  Retractors and sponges are removed. Under fascia hemostasis is completed with cauterization. The fascia is then closed with 2 running sutures of 0 Vicryl meeting midline. The wound is irrigated with warm saline and hemostasis is completed with cauterization.   At this time we identify the abdominal wall mass located above the fascia, in the fatty tissue, 2 cm above the previous Pfannenstiel and measuring 4 cm. We grab it with Allis forceps and proceed with its dissection using scissors and bipolar cauterization.  The wound is irrigated with warm saline to confirm satisfactory hemostasis. The fascia is evaluated and noted to be intact without fascial defect.  The skin is then closed with a subcuticular suture of 3-0 Monocryl and Steri-Strips.  Instrument and sponge count is complete x2. Estimated blood loss is 600 cc.  The procedure is well tolerated by the patient who is taken to recovery room in a well and stable condition.  female baby named Layla Nor was born at 15:44 and received an Apgar of 8  at 1 minute and 8 at 5 minutes.    Specimen: Placenta sent to L & D, abdominal wall mass to pathology   [facility] A MD 2/24/20173:13 PM

## 2015-04-18 NOTE — Anesthesia Preprocedure Evaluation (Addendum)
Anesthesia Evaluation  Patient identified by MRN, date of birth, ID band Patient awake    Reviewed: Allergy & Precautions, NPO status , Patient's Chart, lab work & pertinent test results  History of Anesthesia Complications Negative for: history of anesthetic complications  Airway Mallampati: II  TM Distance: >3 FB Neck ROM: Full    Dental no notable dental hx. (+) Dental Advisory Given   Pulmonary Current Smoker,    Pulmonary exam normal breath sounds clear to auscultation       Cardiovascular hypertension, Pt. on medications and Pt. on home beta blockers Normal cardiovascular exam Rhythm:Regular Rate:Normal     Neuro/Psych PSYCHIATRIC DISORDERS Anxiety negative neurological ROS     GI/Hepatic Neg liver ROS, GERD  ,  Endo/Other  Morbid obesity  Renal/GU negative Renal ROS  negative genitourinary   Musculoskeletal negative musculoskeletal ROS (+)   Abdominal   Peds negative pediatric ROS (+)  Hematology negative hematology ROS (+)   Anesthesia Other Findings   Reproductive/Obstetrics (+) Pregnancy                            Anesthesia Physical Anesthesia Plan  ASA: III  Anesthesia Plan: Spinal   Post-op Pain Management:    Induction:   Airway Management Planned:   Additional Equipment:   Intra-op Plan:   Post-operative Plan:   Informed Consent: I have reviewed the patients History and Physical, chart, labs and discussed the procedure including the risks, benefits and alternatives for the proposed anesthesia with the patient or authorized representative who has indicated his/her understanding and acceptance.   Dental advisory given  Plan Discussed with: CRNA  Anesthesia Plan Comments:         Anesthesia Quick Evaluation

## 2015-04-18 NOTE — Interval H&P Note (Signed)
History and Physical Interval Note:  04/18/2015 2:47 PM  Mary Dennis  has presented today for surgery, with the diagnosis of Prior Cesarean Section  The various methods of treatment have been discussed with the patient and family. After consideration of risks, benefits and other options for treatment, the patient has consented to  Procedure(s): CESAREAN SECTION (N/A) as a surgical intervention .  The patient's history has been reviewed, patient examined, no change in status, stable for surgery.  I have reviewed the patient's chart and labs.  Questions were answered to the patient's satisfaction.     [name] A

## 2015-04-18 NOTE — Transfer of Care (Signed)
Immediate Anesthesia Transfer of Care Note  Patient: Mary Dennis  Procedure(s) Performed: Procedure(s): CESAREAN SECTION (N/A)  Patient Location: PACU  Anesthesia Type:Spinal  Level of Consciousness: awake, alert , oriented and patient cooperative  Airway & Oxygen Therapy: Patient Spontanous Breathing  Post-op Assessment: Report given to RN and Post -op Vital signs reviewed and stable  Post vital signs: Reviewed and stable  Last Vitals:  Filed Vitals:   04/18/15 1124  BP: 138/90  Pulse: 82  Temp: 36.8 C  Resp: 16    Complications: No apparent anesthesia complications

## 2015-04-18 NOTE — Anesthesia Preprocedure Evaluation (Deleted)
Anesthesia Evaluation  Patient identified by MRN, date of birth, ID band Patient awake    Reviewed: Allergy & Precautions, NPO status , Patient's Chart, lab work & pertinent test results  Airway        Dental   Pulmonary Current Smoker,           Cardiovascular hypertension, Pt. on medications      Neuro/Psych negative neurological ROS     GI/Hepatic Neg liver ROS, GERD  ,  Endo/Other  negative endocrine ROS  Renal/GU negative Renal ROS     Musculoskeletal   Abdominal   Peds  Hematology negative hematology ROS (+)   Anesthesia Other Findings   Reproductive/Obstetrics                             Lab Results  Component Value Date   WBC 9.5 04/17/2015   HGB 12.9 04/17/2015   HCT 37.7 04/17/2015   MCV 92.9 04/17/2015   PLT 272 04/17/2015    Anesthesia Physical Anesthesia Plan  ASA: II  Anesthesia Plan: Spinal   Post-op Pain Management:    Induction:   Airway Management Planned: Natural Airway  Additional Equipment:   Intra-op Plan:   Post-operative Plan:   Informed Consent: I have reviewed the patients History and Physical, chart, labs and discussed the procedure including the risks, benefits and alternatives for the proposed anesthesia with the patient or authorized representative who has indicated his/her understanding and acceptance.     Plan Discussed with: CRNA  Anesthesia Plan Comments:         Anesthesia Quick Evaluation

## 2015-04-18 NOTE — Lactation Note (Addendum)
This note was copied from a baby's chart. Lactation Consultation Note Initial visit at 6 hours of age.  Mom reports several good feedings. Baby is awake in crib, FOB request assist with swaddling baby.  Mom is eating.  [facility] RN reports previous good latch and mom denies concerns at this time.  [facility] LC resources given and discussed.  Encouraged to feed with early cues on demand.  Early newborn behavior discussed.  Hand expression reported by mom with colostrum visible.  Chart indicated history of PCOS mom reports good changes in breasts during pregnancy.  Mom to call for assist as needed.    Patient Name: Girl Shenae Bonanno [id number] Date: 04/18/2015 Reason for consult: Initial assessment   Maternal Data Has patient been taught Hand Expression?: Yes Does the patient have breastfeeding experience prior to this delivery?: Yes  Feeding Feeding Type: Breast Fed Length of feed: 23 min  LATCH Score/Interventions Latch: Grasps breast easily, tongue down, lips flanged, rhythmical sucking.  Audible Swallowing: A few with stimulation  Type of Nipple: Everted at rest and after stimulation  Comfort (Breast/Nipple): Soft / non-tender     Hold (Positioning): Assistance needed to correctly position infant at breast and maintain latch. Intervention(s): Breastfeeding basics reviewed  LATCH Score: 8  Lactation Tools Discussed/Used WIC Program: Yes   Consult Status Consult Status: Follow-up Date: 04/19/15 Follow-up type: In-patient    Jenkins Risdon, Arvella Merles 04/18/2015, 10:17 PM

## 2015-04-19 LAB — CBC
HCT: 34.4 % — ABNORMAL LOW (ref 36.0–46.0)
HEMOGLOBIN: 11.5 g/dL — AB (ref 12.0–15.0)
MCH: 31.2 pg (ref 26.0–34.0)
MCHC: 33.4 g/dL (ref 30.0–36.0)
MCV: 93.2 fL (ref 78.0–100.0)
PLATELETS: 220 10*3/uL (ref 150–400)
RBC: 3.69 MIL/uL — AB (ref 3.87–5.11)
RDW: 13.7 % (ref 11.5–15.5)
WBC: 11.6 10*3/uL — AB (ref 4.0–10.5)

## 2015-04-19 MED ORDER — LACTATED RINGERS IV SOLN
INTRAVENOUS | Status: DC
  Administered 2015-04-19: 02:00:00 via INTRAVENOUS

## 2015-04-19 NOTE — Anesthesia Postprocedure Evaluation (Signed)
Anesthesia Post Note  Patient: Mary Dennis  Procedure(s) Performed: Procedure(s) (LRB): CESAREAN SECTION (N/A)  Patient location during evaluation: Mother Baby Anesthesia Type: Spinal Level of consciousness: awake and alert and oriented Pain management: satisfactory to patient Vital Signs Assessment: post-procedure vital signs reviewed and stable Respiratory status: nonlabored ventilation, respiratory function stable and spontaneous breathing Cardiovascular status: stable Postop Assessment: no headache, no backache, no signs of nausea or vomiting and adequate PO intake Anesthetic complications: no    Last Vitals:  Filed Vitals:   04/19/15 0108 04/19/15 0503  BP: 123/60 113/56  Pulse: 77 71  Temp: 37.1 C 36.8 C  Resp: 18 18    Last Pain:  Filed Vitals:   04/19/15 0508  PainSc: 0-No pain                 Jaylah Goodlow

## 2015-04-20 MED ORDER — OXYCODONE-ACETAMINOPHEN 5-325 MG PO TABS: 1.0000 | ORAL_TABLET | ORAL | Status: AC | PRN

## 2015-04-20 MED ORDER — LABETALOL HCL 100 MG PO TABS: 100.0000 mg | ORAL_TABLET | Freq: Two times a day (BID) | ORAL | Status: AC

## 2015-04-20 MED ORDER — IBUPROFEN 600 MG PO TABS: 600.0000 mg | ORAL_TABLET | Freq: Four times a day (QID) | ORAL | Status: AC | PRN

## 2015-04-20 NOTE — Progress Notes (Signed)
Mary Dennis [id number]  Subjective: Postpartum Day 2: Repeat C/S due to previous cesarean section and abdominal wall mass Doing well, desires to go home today Denies HA, visual disturbances or epigastric pain Patient up ad lib, reports no syncope or dizziness. Reports passing gas and recent bowel movement Pain well controlled on Ibuprofen Feeding:  Breast Contraceptive plan: Declines  Objective: Temp:  [97.8 F (36.6 C)-98.3 F (36.8 C)] 98.3 F (36.8 C) (02/26 0646) Pulse Rate:  [68-88] 88 (02/26 0646) Resp:  [16-18] 18 (02/26 0646) BP: (117-142)/(59-78) 128/69 mmHg (02/26 0646) SpO2:  [98 %] 98 % (02/26 0646) Weight:  [102.967 kg (227 lb)] 102.967 kg (227 lb) (02/26 0700)    Filed Vitals:   04/19/15 2032 04/19/15 2204 04/20/15 0646 04/20/15 0700  BP: 135/72 127/64 128/69   Pulse: 82 78 88   Temp:   98.3 F (36.8 C)   TempSrc:   Oral   Resp:   18   Height:     (1.626 m)  Weight:    102.967 kg (227 lb)  SpO2:   98%      CBC Latest Ref Rng 04/19/2015 04/17/2015  WBC 4.0 - 10.5 K/uL 11.6(H) 9.5  Hemoglobin 12.0 - 15.0 g/dL 11.5(L) 12.9  Hematocrit 36.0 - 46.0 % 34.4(L) 37.7  Platelets 150 - 400 K/uL 220 272     Physical Exam:  General: alert and cooperative Lochia: appropriate Uterine Fundus: firm Abdomen:  + bowel sounds, NT Incision: Honeycomb dressing CDI DVT Evaluation: No evidence of DVT seen on physical exam. JP drain:   none  Assessment/Plan: Status post cesarean delivery, day 2. Normotensive  Stable Continue current care. Discharge home today if infant able to discharge    Alphonzo Severance MSN, CNM 04/20/2015, 10:28 AM

## 2015-04-20 NOTE — Discharge Instructions (Signed)
Postpartum Care After Cesarean Delivery After you deliver your newborn (postpartum period), the usual stay in the hospital is 24-72 hours. If there were problems with your labor or delivery, or if you have other medical problems, you might be in the hospital longer.  While you are in the hospital, you will receive help and instructions on how to care for yourself and your newborn during the postpartum period.  While you are in the hospital:  It is normal for you to have pain or discomfort from the incision in your abdomen. Be sure to tell your nurses when you are having pain, where the pain is located, and what makes the pain worse.  If you are breastfeeding, you may feel uncomfortable contractions of your uterus for a couple of weeks. This is normal. The contractions help your uterus get back to normal size.  It is normal to have some bleeding after delivery.  For the first 1-3 days after delivery, the flow is red and the amount may be similar to a period.  It is common for the flow to start and stop.  In the first few days, you may pass some small clots. Let your nurses know if you begin to pass large clots or your flow increases.  Do not  flush blood clots down the toilet before having the nurse look at them.  During the next 3-10 days after delivery, your flow should become more watery and pink or brown-tinged in color.  Ten to fourteen days after delivery, your flow should be a small amount of yellowish-white discharge.  The amount of your flow will decrease over the first few weeks after delivery. Your flow may stop in 6-8 weeks. Most women have had their flow stop by 12 weeks after delivery.  You should change your sanitary pads frequently.  Wash your hands thoroughly with soap and water for at least 20 seconds after changing pads, using the toilet, or before holding or feeding your newborn.  Your intravenous (IV) tubing will be removed when you are drinking enough fluids.  The  urine drainage tube (urinary catheter) that was inserted before delivery may be removed within 6-8 hours after delivery or when feeling returns to your legs. You should feel like you need to empty your bladder within the first 6-8 hours after the catheter has been removed.  In case you become weak, lightheaded, or faint, call your nurse before you get out of bed for the first time and before you take a shower for the first time.  Within the first few days after delivery, your breasts may begin to feel tender and full. This is called engorgement. Breast tenderness usually goes away within 48-72 hours after engorgement occurs. You may also notice milk leaking from your breasts. If you are not breastfeeding, do not stimulate your breasts. Breast stimulation can make your breasts produce more milk.  Spending as much time as possible with your newborn is very important. During this time, you and your newborn can feel close and get to know each other. Having your newborn stay in your room (rooming in) will help to strengthen the bond with your newborn. It will give you time to get to know your newborn and become comfortable caring for your newborn.  Your hormones change after delivery. Sometimes the hormone changes can temporarily cause you to feel sad or tearful. These feelings should not last more than a few days. If these feelings last longer than that, you should talk to your  caregiver. °· If desired, talk to your caregiver about methods of family planning or contraception. °· Talk to your caregiver about immunizations. Your caregiver may want you to have the following immunizations before leaving the hospital: °· Tetanus, diphtheria, and pertussis (Tdap) or tetanus and diphtheria (Td) immunization. It is very important that you and your family (including grandparents) or others caring for your newborn are up-to-date with the Tdap or Td immunizations. The Tdap or Td immunization can help protect your newborn  from getting ill. °· Rubella immunization. °· Varicella (chickenpox) immunization. °· Influenza immunization. You should receive this annual immunization if you did not receive the immunization during your pregnancy. °  °This information is not intended to replace advice given to you by your health care provider. Make sure you discuss any questions you have with your health care provider. °  °Document Released: 11/03/2011 Document Reviewed: 11/03/2011 °Elsevier Interactive Patient Education ©2016 [employer] °Breastfeeding °Deciding to breastfeed is one of the best choices you can make for you and your baby. A change in hormones during pregnancy causes your breast tissue to grow and increases the number and size of your milk ducts. These hormones also allow proteins, sugars, and fats from your blood supply to make breast milk in your milk-producing glands. Hormones prevent breast milk from being released before your baby is born as well as prompt milk flow after birth. Once breastfeeding has begun, thoughts of your baby, as well as his or her sucking or crying, can stimulate the release of milk from your milk-producing glands.  °BENEFITS OF BREASTFEEDING °For Your Baby °· Your first milk (colostrum) helps your baby's digestive system function better. °· There are antibodies in your milk that help your baby fight off infections. °· Your baby has a lower incidence of asthma, allergies, and sudden infant death syndrome. °· The nutrients in breast milk are better for your baby than infant formulas and are designed uniquely for your baby's needs. °· Breast milk improves your baby's brain development. °· Your baby is less likely to develop other conditions, such as childhood obesity, asthma, or type 2 diabetes mellitus. °For You °· Breastfeeding helps to create a very special bond between you and your baby. °· Breastfeeding is convenient. Breast milk is always available at the correct temperature and costs  nothing. °· Breastfeeding helps to burn calories and helps you lose the weight gained during pregnancy. °· Breastfeeding makes your uterus contract to its prepregnancy size faster and slows bleeding (lochia) after you give birth.   °· Breastfeeding helps to lower your risk of developing type 2 diabetes mellitus, osteoporosis, and breast or ovarian cancer later in life. °SIGNS THAT YOUR BABY IS HUNGRY °Early Signs of Hunger °· Increased alertness or activity. °· Stretching. °· Movement of the head from side to side. °· Movement of the head and opening of the mouth when the corner of the mouth or cheek is stroked (rooting). °· Increased sucking sounds, smacking lips, cooing, sighing, or squeaking. °· Hand-to-mouth movements. °· Increased sucking of fingers or hands. °Late Signs of Hunger °· Fussing. °· Intermittent crying. °Extreme Signs of Hunger °Signs of extreme hunger will require calming and consoling before your baby will be able to breastfeed successfully. Do not wait for the following signs of extreme hunger to occur before you initiate breastfeeding: °· Restlessness. °· A loud, strong cry. °· Screaming. °BREASTFEEDING BASICS °Breastfeeding Initiation °· Find a comfortable place to sit or lie down, with your neck and back well supported. °· Place   a pillow or rolled up blanket under your baby to bring him or her to the level of your breast (if you are seated). Nursing pillows are specially designed to help support your arms and your baby while you breastfeed.  Make sure that your baby's abdomen is facing your abdomen.  Gently massage your breast. With your fingertips, massage from your chest wall toward your nipple in a circular motion. This encourages milk flow. You may need to continue this action during the feeding if your milk flows slowly.  Support your breast with 4 fingers underneath and your thumb above your nipple. Make sure your fingers are well away from your nipple and your baby's  mouth.  Stroke your baby's lips gently with your finger or nipple.  When your baby's mouth is open wide enough, quickly bring your baby to your breast, placing your entire nipple and as much of the colored area around your nipple (areola) as possible into your baby's mouth.  More areola should be visible above your baby's upper lip than below the lower lip.  Your baby's tongue should be between his or her lower gum and your breast.  Ensure that your baby's mouth is correctly positioned around your nipple (latched). Your baby's lips should create a seal on your breast and be turned out (everted).  It is common for your baby to suck about 2-3 minutes in order to start the flow of breast milk. Latching Teaching your baby how to latch on to your breast properly is very important. An improper latch can cause nipple pain and decreased milk supply for you and poor weight gain in your baby. Also, if your baby is not latched onto your nipple properly, he or she may swallow some air during feeding. This can make your baby fussy. Burping your baby when you switch breasts during the feeding can help to get rid of the air. However, teaching your baby to latch on properly is still the best way to prevent fussiness from swallowing air while breastfeeding. Signs that your baby has successfully latched on to your nipple:  Silent tugging or silent sucking, without causing you pain.  Swallowing heard between every 3-4 sucks.  Muscle movement above and in front of his or her ears while sucking. Signs that your baby has not successfully latched on to nipple:  Sucking sounds or smacking sounds from your baby while breastfeeding.  Nipple pain. If you think your baby has not latched on correctly, slip your finger into the corner of your baby's mouth to break the suction and place it between your baby's gums. Attempt breastfeeding initiation again. Signs of Successful Breastfeeding Signs from your baby:  A  gradual decrease in the number of sucks or complete cessation of sucking.  Falling asleep.  Relaxation of his or her body.  Retention of a small amount of milk in his or her mouth.  Letting go of your breast by himself or herself. Signs from you:  Breasts that have increased in firmness, weight, and size 1-3 hours after feeding.  Breasts that are softer immediately after breastfeeding.  Increased milk volume, as well as a change in milk consistency and color by the fifth day of breastfeeding.  Nipples that are not sore, cracked, or bleeding. Signs That Your [employer] is Getting Enough Milk  Wetting at least 3 diapers in a 24-hour period. The urine should be clear and pale yellow by age 54 days.  At least 3 stools in a 24-hour period by age  5 days. The stool should be soft and yellow. °· At least 3 stools in a 24-hour period by age 7 days. The stool should be seedy and yellow. °· No loss of weight greater than 10% of birth weight during the first 3 days of age. °· Average weight gain of 4-7 ounces (113-198 g) per week after age 4 days. °· Consistent daily weight gain by age 5 days, without weight loss after the age of 2 weeks. °After a feeding, your baby may spit up a small amount. This is common. °BREASTFEEDING FREQUENCY AND DURATION °Frequent feeding will help you make more milk and can prevent sore nipples and breast engorgement. Breastfeed when you feel the need to reduce the fullness of your breasts or when your baby shows signs of hunger. This is called "breastfeeding on demand." Avoid introducing a pacifier to your baby while you are working to establish breastfeeding (the first 4-6 weeks after your baby is born). After this time you may choose to use a pacifier. Research has shown that pacifier use during the first year of a baby's life decreases the risk of sudden infant death syndrome (SIDS). °Allow your baby to feed on each breast as long as he or she wants. Breastfeed until your baby is  finished feeding. When your baby unlatches or falls asleep while feeding from the first breast, offer the second breast. Because newborns are often sleepy in the first few weeks of life, you may need to awaken your baby to get him or her to feed. °Breastfeeding times will vary from baby to baby. However, the following rules can serve as a guide to help you ensure that your baby is properly fed: °· Newborns (babies 4 weeks of age or younger) may breastfeed every 1-3 hours. °· Newborns should not go longer than 3 hours during the day or 5 hours during the night without breastfeeding. °· You should breastfeed your baby a minimum of 8 times in a 24-hour period until you begin to introduce solid foods to your baby at around 6 months of age. °BREAST MILK PUMPING °Pumping and storing breast milk allows you to ensure that your baby is exclusively fed your breast milk, even at times when you are unable to breastfeed. This is especially important if you are going back to work while you are still breastfeeding or when you are not able to be present during feedings. Your lactation consultant can give you guidelines on how long it is safe to store breast milk. °A breast pump is a machine that allows you to pump milk from your breast into a sterile bottle. The pumped breast milk can then be stored in a refrigerator or freezer. Some breast pumps are operated by hand, while others use electricity. Ask your lactation consultant which type will work best for you. Breast pumps can be purchased, but some hospitals and breastfeeding support groups lease breast pumps on a monthly basis. A lactation consultant can teach you how to hand express breast milk, if you prefer not to use a pump. °CARING FOR YOUR BREASTS WHILE YOU BREASTFEED °Nipples can become dry, cracked, and sore while breastfeeding. The following recommendations can help keep your breasts moisturized and healthy: °· Avoid using soap on your nipples. °· Wear a supportive bra.  Although not required, special nursing bras and tank tops are designed to allow access to your breasts for breastfeeding without taking off your entire bra or top. Avoid wearing underwire-style bras or extremely tight bras. °· Air dry   your nipples for 3-62mnutes after each feeding.  Use only cotton bra pads to absorb leaked breast milk. Leaking of breast milk between feedings is normal.  Use lanolin on your nipples after breastfeeding. Lanolin helps to maintain your skin's normal moisture barrier. If you use pure lanolin, you do not need to wash it off before feeding your baby again. Pure lanolin is not toxic to your baby. You may also hand express a few drops of breast milk and gently massage that milk into your nipples and allow the milk to air dry. In the first few weeks after giving birth, some women experience extremely full breasts (engorgement). Engorgement can make your breasts feel heavy, warm, and tender to the touch. Engorgement peaks within 3-5 days after you give birth. The following recommendations can help ease engorgement:  Completely empty your breasts while breastfeeding or pumping. You may want to start by applying warm, moist heat (in the shower or with warm water-soaked hand towels) just before feeding or pumping. This increases circulation and helps the milk flow. If your baby does not completely empty your breasts while breastfeeding, pump any extra milk after he or she is finished.  Wear a snug bra (nursing or regular) or tank top for 1-2 days to signal your body to slightly decrease milk production.  Apply ice packs to your breasts, unless this is too uncomfortable for you.  Make sure that your baby is latched on and positioned properly while breastfeeding. If engorgement persists after 48 hours of following these recommendations, contact your health care provider or a lScience writer OVERALL HEALTH CARE RECOMMENDATIONS WHILE BREASTFEEDING  Eat healthy foods.  Alternate between meals and snacks, eating 3 of each per day. Because what you eat affects your breast milk, some of the foods may make your baby more irritable than usual. Avoid eating these foods if you are sure that they are negatively affecting your baby.  Drink milk, fruit juice, and water to satisfy your thirst (about 10 glasses a day).  Rest often, relax, and continue to take your prenatal vitamins to prevent fatigue, stress, and anemia.  Continue breast self-awareness checks.  Avoid chewing and smoking tobacco. Chemicals from cigarettes that pass into breast milk and exposure to secondhand smoke may harm your baby.  Avoid alcohol and drug use, including marijuana. Some medicines that may be harmful to your baby can pass through breast milk. It is important to ask your health care provider before taking any medicine, including all over-the-counter and prescription medicine as well as vitamin and herbal supplements. It is possible to become pregnant while breastfeeding. If birth control is desired, ask your health care provider about options that will be safe for your baby. SEEK MEDICAL CARE IF:  You feel like you want to stop breastfeeding or have become frustrated with breastfeeding.  You have painful breasts or nipples.  Your nipples are cracked or bleeding.  Your breasts are red, tender, or warm.  You have a swollen area on either breast.  You have a fever or chills.  You have nausea or vomiting.  You have drainage other than breast milk from your nipples.  Your breasts do not become full before feedings by the fifth day after you give birth.  You feel sad and depressed.  Your baby is too sleepy to eat well.  Your baby is having trouble sleeping.   Your baby is wetting less than 3 diapers in a 24-hour period.  Your baby has less than 3 stools in  a 24-hour period.  Your baby's skin or the white part of his or her eyes becomes yellow.   Your baby is not gaining  weight by 72 days of age. SEEK IMMEDIATE MEDICAL CARE IF:  Your baby is overly tired (lethargic) and does not want to wake up and feed.  Your baby develops an unexplained fever.   This information is not intended to replace advice given to you by your health care provider. Make sure you discuss any questions you have with your health care provider.   Document Released: 02/08/2005 Document Revised: 10/30/2014 Document Reviewed: 08/02/2012 Elsevier Interactive Patient Education 2016 [employer]. Iron-Rich Diet Iron is a mineral that helps your body to produce hemoglobin. Hemoglobin is a protein in your red blood cells that carries oxygen to your body's tissues. Eating too little iron may cause you to feel weak and tired, and it can increase your risk for infection. Eating enough iron is necessary for your body's metabolism, muscle function, and nervous system. Iron is naturally found in many foods. It can also be added to foods or fortified in foods. There are two types of dietary iron:  Heme iron. Heme iron is absorbed by the body more easily than nonheme iron. Heme iron is found in meat, poultry, and fish.  Nonheme iron. Nonheme iron is found in dietary supplements, iron-fortified grains, beans, and vegetables. You may need to follow an iron-rich diet if:  You have been diagnosed with iron deficiency or iron-deficiency anemia.  You have a condition that prevents you from absorbing dietary iron, such as:  Infection in your intestines.  Celiac disease. This involves long-lasting (chronic) inflammation of your intestines.  You do not eat enough iron.  You eat a diet that is high in foods that impair iron absorption.  You have lost a lot of blood.  You have heavy bleeding during your menstrual cycle.  You are pregnant. WHAT IS MY PLAN? Your health care provider may help you to determine how much iron you need per day based on your condition. Generally, when a person consumes  sufficient amounts of iron in the diet, the following iron needs are met:  Men.  2-66 years old: 11 mg per day.  46-26 years old: 8 mg per day.  Women.   71-26 years old: 15 mg per day.  79-86 years old: 18 mg per day.  Over 83 years old: 8 mg per day.  Pregnant women: 27 mg per day.  Breastfeeding women: 9 mg per day. WHAT DO I NEED TO KNOW ABOUT AN IRON-RICH DIET?  Eat fresh fruits and vegetables that are high in vitamin C along with foods that are high in iron. This will help increase the amount of iron that your body absorbs from food, especially with foods containing nonheme iron. Foods that are high in vitamin C include oranges, peppers, tomatoes, and mango.  Take iron supplements only as directed by your health care provider. Overdose of iron can be life-threatening. If you were prescribed iron supplements, take them with orange juice or a vitamin C supplement.  Cook foods in pots and pans that are made from iron.   Eat nonheme iron-containing foods alongside foods that are high in heme iron. This helps to improve your iron absorption.   Certain foods and drinks contain compounds that impair iron absorption. Avoid eating these foods in the same meal as iron-rich foods or with iron supplements. These include:  Coffee, black tea, and red wine.  Milk, dairy  products, and foods that are high in calcium.  Beans, soybeans, and peas.  Whole grains.  When eating foods that contain both nonheme iron and compounds that impair iron absorption, follow these tips to absorb iron better.   Soak beans overnight before cooking.  Soak whole grains overnight and drain them before using.  Ferment flours before baking, such as using yeast in bread dough. WHAT FOODS CAN I EAT? Grains Iron-fortified breakfast cereal. Iron-fortified whole-wheat bread. Enriched rice. Sprouted grains. Vegetables Spinach. Potatoes with skin. Green peas. Broccoli. Red and green bell peppers.  Fermented vegetables. Fruits Prunes. Raisins. Oranges. Strawberries. Mango. Grapefruit. Meats and Other Protein Sources Beef liver. Oysters. Beef. Shrimp. [place]. Chicken. [place]. Sardines. Chickpeas. Nuts. Tofu. Beverages Tomato juice. Fresh orange juice. Prune juice. Hibiscus tea. Fortified instant breakfast shakes. Condiments Tahini. Fermented soy sauce. Sweets and Desserts Black-strap molasses.  Other Wheat germ. The items listed above may not be a complete list of recommended foods or beverages. Contact your dietitian for more options. WHAT FOODS ARE NOT RECOMMENDED? Grains Whole grains. Bran cereal. Bran flour. Oats. Vegetables Artichokes. Brussels sprouts. Kale. Fruits Blueberries. Raspberries. Strawberries. Figs. Meats and Other Protein Sources Soybeans. Products made from soy protein. Dairy Milk. Cream. Cheese. Yogurt. Cottage cheese. Beverages Coffee. Black tea. Red wine. Sweets and Desserts Cocoa. Chocolate. Ice cream. Other Basil. Oregano. Parsley. The items listed above may not be a complete list of foods and beverages to avoid. Contact your dietitian for more information.   This information is not intended to replace advice given to you by your health care provider. Make sure you discuss any questions you have with your health care provider.   Document Released: 09/22/2004 Document Revised: 03/01/2014 Document Reviewed: 09/05/2013 Elsevier Interactive Patient Education [employer].

## 2015-04-20 NOTE — Progress Notes (Signed)
CLINICAL SOCIAL WORK MATERNAL/CHILD NOTE  Patient Details  Name: Mary Dennis MRN: [id number] Date of Birth: 04/18/2014  Date: 04/20/2015  Clinical Social Worker Initiating Note: Yeriel Mineo, LCSWDate/ Time Initiated: 04/20/15/1340   Child's Name: Mary Dennis   Legal Guardian:  (Parents Laurena Slimmer and Catering manager Dennis)   Need for Interpreter: None   Date of Referral: 04/18/15   Reason for Referral: Other (Comment)   Referral Source: [facility]   Address: [address] [place] [address]  Phone number:  [phone])   Household Members: Minor Children, Self   Natural Supports (not living in the home): Extended Family, Immediate Family   Professional Supports:None   Employment: ([employer] is employed)   Type of Work:     Education:     Museum/gallery curator Resources:Medicaid   Other Resources: [employer], Physicist, medical    Cultural/Religious Considerations Which May Impact Care: none noted  Strengths: Ability to meet basic needs , Home prepared for child    Risk Factors/Current Problems: None   Cognitive State: Alert , Able to Concentrate    Mood/Affect: Happy    CSW Assessment: Acknowledged order for social work consult to assess mother's hx of anxiety. Met with mother who was pleasant and receptive to social work. She is a single parent with one other dependent age 39. MOB reports hx of anxiety. Informed that she was never formally diagnosed or treated, but recognize the symptoms. Informed that she is aware of the triggers and and uses various strategies (taking a bath, listening to music, controlled breathing ...) for symptom reduction. She denies any current symptoms of depression or anxiety. Informed that she and FOB are no longer in a relationship, but they plan to co-parent and she believes that he will remain supportive. No acute social concerns noted or reported at this time. Mother informed of social work  Fish farm manager.  CSW Plan/Description:    Discussed signs/symptoms of PP Depression and available resources No further intervention required No barriers to discharge   Emile Ringgenberg J, LCSW 04/20/2015, 1:43 PM

## 2015-04-20 NOTE — Discharge Summary (Signed)
OB Discharge Summary     Patient Name: Mary Dennis DOB: 04-27-1985 MRN: [id number]  Date of admission: 04/18/2015 Delivering MD: Silverio Lay   Date of discharge: 04/20/2015  Admitting diagnosis: CTX Prior Cesarean Section Intrauterine pregnancy: [redacted]w[redacted]d     Secondary diagnosis:  Principal Problem:   Cesarean delivery delivered Active Problems:   Previous cesarean section   Chronic hypertension during pregnancy, antepartum   Endometrioma   H/O hernia repair--1993   Rubella non-immune status, antepartum  Additional problems: none     Discharge diagnosis: Term Pregnancy Delivered and CHTN                                                                                                Post partum procedures:none  Augmentation: none  Complications: None  Hospital course:  Sceduled C/S   30 y.o. yo G2P2002 at [redacted]w[redacted]d was admitted to the hospital 04/18/2015 for scheduled cesarean section with the following indication:Elective Repeat.  Membrane Rupture Time/Date: 3:43 PM ,04/18/2015   Patient delivered a Viable infant.04/18/2015  Details of operation can be found in separate operative note.  Pateint had an uncomplicated postpartum course.  She is ambulating, tolerating a regular diet, passing flatus, and urinating well. Patient is discharged home in stable condition on  04/20/2015          Physical exam  Filed Vitals:   04/19/15 2032 04/19/15 2204 04/20/15 0646 04/20/15 0700  BP: 135/72 127/64 128/69   Pulse: 82 78 88   Temp:   98.3 F (36.8 C)   TempSrc:   Oral   Resp:   18   Height:     (1.626 m)  Weight:    102.967 kg (227 lb)  SpO2:   98%    General: alert and cooperative Lochia: appropriate Uterine Fundus: firm Incision: Healing well with no significant drainage DVT Evaluation: No evidence of DVT seen on physical exam. Negative Homan's sign. Labs: Lab Results  Component Value Date   WBC 11.6* 04/19/2015   HGB 11.5* 04/19/2015   HCT 34.4* 04/19/2015   MCV 93.2 04/19/2015   PLT 220 04/19/2015   No flowsheet data found.  Discharge instruction: per After Visit Summary and "Baby and Me Booklet".  After visit meds:    Medication List    STOP taking these medications        sodium chloride 0.65 % Soln nasal spray  Commonly known as:  OCEAN      TAKE these medications        ibuprofen 600 MG tablet  Commonly known as:  ADVIL,MOTRIN  Take 1 tablet (600 mg total) by mouth every 6 (six) hours as needed.     labetalol 100 MG tablet  Commonly known as:  NORMODYNE  Take 1 tablet (100 mg total) by mouth 2 (two) times daily.     oxyCODONE-acetaminophen 5-325 MG tablet  Commonly known as:  PERCOCET  Take 1 tablet by mouth every 4 (four) hours as needed for severe pain.     PRENATE DHA 18-0.6-0.4-300 MG Caps  Take 1 tablet by mouth daily.  ranitidine 150 MG tablet  Commonly known as:  ZANTAC  Take 150 mg by mouth daily as needed for heartburn.        Diet: routine diet  Activity: Advance as tolerated. Pelvic rest for 6 weeks.   Outpatient follow ZO:XWRUE Start RN to visit/BP check within first week, Schedule 6 wks postpartum visit at [facility] Follow up Appt:No future appointments. Follow up Visit:No Follow-up on file.  Postpartum contraception: Declines  Newborn Data: Live born female  Birth Weight: 7 lb 12.7 oz (3535 g) APGAR: 8, 8  Baby Feeding: Breast Disposition:home with mother   04/20/2015 Alphonzo Severance, CNM

## 2015-04-21 LAB — TYPE AND SCREEN
ABO/RH(D): O POS
ANTIBODY SCREEN: NEGATIVE
UNIT DIVISION: 0
Unit division: 0

## 2015-04-22 ENCOUNTER — Encounter (HOSPITAL_COMMUNITY): Payer: Self-pay | Admitting: Obstetrics and Gynecology
